# Patient Record
Sex: Male | Born: 1990
Health system: Southern US, Community
[De-identification: ages and names within clinical notes are randomized; demographics above are authoritative.]

## PROBLEM LIST (undated history)

## (undated) DIAGNOSIS — Z113 Encounter for screening for infections with a predominantly sexual mode of transmission: Secondary | ICD-10-CM

## (undated) DIAGNOSIS — N179 Acute kidney failure, unspecified: Secondary | ICD-10-CM

## (undated) DIAGNOSIS — Z21 Asymptomatic human immunodeficiency virus [HIV] infection status: Secondary | ICD-10-CM

## (undated) DIAGNOSIS — B20 Human immunodeficiency virus [HIV] disease: Secondary | ICD-10-CM

## (undated) DIAGNOSIS — I10 Essential (primary) hypertension: Secondary | ICD-10-CM

## (undated) DIAGNOSIS — F17200 Nicotine dependence, unspecified, uncomplicated: Secondary | ICD-10-CM

## (undated) HISTORY — DX: Human immunodeficiency virus (HIV) disease: B20

## (undated) HISTORY — DX: Asymptomatic human immunodeficiency virus (hiv) infection status: Z21

## (undated) HISTORY — DX: Encounter for screening for infections with a predominantly sexual mode of transmission: Z11.3

## (undated) HISTORY — DX: Acute kidney failure, unspecified: N17.9

## (undated) HISTORY — DX: Nicotine dependence, unspecified, uncomplicated: F17.200

---
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
## 2008-02-07 ENCOUNTER — Emergency Department (HOSPITAL_COMMUNITY): Admission: EM | Admit: 2008-02-07 | Discharge: 2008-02-07 | Payer: Self-pay | Admitting: Family Medicine

## 2008-05-04 ENCOUNTER — Emergency Department (HOSPITAL_COMMUNITY): Admission: EM | Admit: 2008-05-04 | Discharge: 2008-05-04 | Payer: Self-pay | Admitting: Emergency Medicine

## 2008-12-03 ENCOUNTER — Emergency Department (HOSPITAL_COMMUNITY): Admission: EM | Admit: 2008-12-03 | Discharge: 2008-12-03 | Payer: Self-pay | Admitting: Emergency Medicine

## 2009-07-18 ENCOUNTER — Ambulatory Visit: Payer: Self-pay | Admitting: Diagnostic Radiology

## 2009-07-18 ENCOUNTER — Emergency Department (HOSPITAL_BASED_OUTPATIENT_CLINIC_OR_DEPARTMENT_OTHER): Admission: EM | Admit: 2009-07-18 | Discharge: 2009-07-18 | Payer: Self-pay | Admitting: Emergency Medicine

## 2010-05-15 ENCOUNTER — Emergency Department (HOSPITAL_COMMUNITY)
Admission: EM | Admit: 2010-05-15 | Discharge: 2010-05-15 | Disposition: A | Discharge status: Home / Self Care | Payer: Self-pay | Attending: Emergency Medicine | Admitting: Emergency Medicine

## 2010-05-15 ENCOUNTER — Inpatient Hospital Stay (INDEPENDENT_AMBULATORY_CARE_PROVIDER_SITE_OTHER)
Admission: RE | Admit: 2010-05-15 | Discharge: 2010-05-15 | Disposition: A | Discharge status: Home / Self Care | Payer: Self-pay | Source: Ambulatory Visit | Attending: Family Medicine | Admitting: Family Medicine

## 2010-05-15 DIAGNOSIS — R109 Unspecified abdominal pain: Secondary | ICD-10-CM | POA: Insufficient documentation

## 2010-05-15 LAB — POCT URINALYSIS DIP (DEVICE)
Glucose, UA: NEGATIVE mg/dL
Hgb urine dipstick: NEGATIVE
Ketones, ur: NEGATIVE mg/dL
Specific Gravity, Urine: 1.02 (ref 1.005–1.030)
Urobilinogen, UA: 0.2 mg/dL (ref 0.0–1.0)
pH: 7 (ref 5.0–8.0)

## 2010-06-02 LAB — DIFFERENTIAL
Basophils Absolute: 0 10*3/uL (ref 0.0–0.1)
Eosinophils Absolute: 0 10*3/uL (ref 0.0–0.7)
Eosinophils Relative: 0 % (ref 0–5)
Monocytes Absolute: 1.5 10*3/uL — ABNORMAL HIGH (ref 0.1–1.0)
Monocytes Relative: 19 % — ABNORMAL HIGH (ref 3–12)
Neutro Abs: 5.1 10*3/uL (ref 1.7–7.7)

## 2010-06-02 LAB — CBC
Platelets: 181 10*3/uL (ref 150–400)
RDW: 13.4 % (ref 11.5–15.5)
WBC: 7.8 10*3/uL (ref 4.0–10.5)

## 2010-06-02 LAB — POCT I-STAT, CHEM 8
Calcium, Ion: 1.13 mmol/L (ref 1.12–1.32)
Glucose, Bld: 85 mg/dL (ref 70–99)
Potassium: 3.4 mEq/L — ABNORMAL LOW (ref 3.5–5.1)
Sodium: 137 mEq/L (ref 135–145)

## 2010-06-02 LAB — POCT RAPID STREP A (OFFICE): Streptococcus, Group A Screen (Direct): NEGATIVE

## 2010-06-02 LAB — POCT INFECTIOUS MONO SCREEN: Mono Screen: NEGATIVE
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
## 2013-02-10 ENCOUNTER — Encounter (HOSPITAL_COMMUNITY): Payer: Self-pay | Admitting: Emergency Medicine

## 2013-02-10 ENCOUNTER — Emergency Department (INDEPENDENT_AMBULATORY_CARE_PROVIDER_SITE_OTHER)
Admission: EM | Admit: 2013-02-10 | Discharge: 2013-02-10 | Disposition: A | Discharge status: Home / Self Care | Payer: Self-pay | Source: Home / Self Care | Attending: Emergency Medicine | Admitting: Emergency Medicine

## 2013-02-10 DIAGNOSIS — H6123 Impacted cerumen, bilateral: Secondary | ICD-10-CM

## 2013-02-10 DIAGNOSIS — H6693 Otitis media, unspecified, bilateral: Secondary | ICD-10-CM

## 2013-02-10 DIAGNOSIS — H6690 Otitis media, unspecified, unspecified ear: Secondary | ICD-10-CM

## 2013-02-10 DIAGNOSIS — J069 Acute upper respiratory infection, unspecified: Secondary | ICD-10-CM

## 2013-02-10 DIAGNOSIS — H6120 Impacted cerumen, unspecified ear: Secondary | ICD-10-CM

## 2013-02-10 MED ORDER — AMOXICILLIN 500 MG PO CAPS: 1000.0000 mg | ORAL_CAPSULE | Freq: Three times a day (TID) | ORAL | Status: DC

## 2013-02-10 MED ORDER — FLUTICASONE PROPIONATE 50 MCG/ACT NA SUSP: 2.0000 | Freq: Every day | NASAL | Status: DC

## 2013-02-10 MED ORDER — PREDNISONE 20 MG PO TABS: 20.0000 mg | ORAL_TABLET | Freq: Two times a day (BID) | ORAL | Status: DC

## 2013-02-10 MED ORDER — HYDROCODONE-ACETAMINOPHEN 5-325 MG PO TABS
2.0000 | ORAL_TABLET | Freq: Once | ORAL | Status: AC
  Administered 2013-02-10: 2 via ORAL

## 2013-02-10 MED ORDER — HYDROCODONE-ACETAMINOPHEN 5-325 MG PO TABS
ORAL_TABLET | ORAL | Status: AC
  Filled 2013-02-10: qty 2

## 2013-02-10 MED ORDER — HYDROCODONE-ACETAMINOPHEN 5-325 MG PO TABS: ORAL_TABLET | ORAL | Status: DC

## 2013-02-10 NOTE — ED Notes (Signed)
Pt  Reports  Symptoms  Of  Nasal  Stuffyness        Sinus  Congestion  And  Pressure in  Both  Ears         He  Reports  Symptoms  Started  Yesterday         Symptoms  unreleived  By otc  Sinus  meds

## 2013-02-10 NOTE — ED Provider Notes (Signed)
Chief Complaint:   Chief Complaint  Patient presents with  . URI    History of Present Illness:   Darrell [name] is a 22 year old male who's had a one-week history of bilateral ear pain, pressure, and congestion. He's also had a headache, chills, nasal congestion with bloody drainage, and a cough productive of small amounts of sputum. He denies any fever, sore throat, or adenopathy.  Review of Systems:  Other than noted above, the patient denies any of the following symptoms: Systemic:  No fevers, chills, sweats, weight loss or gain, fatigue, or tiredness. Eye:  No redness, pain, discharge, itching, blurred vision, or diplopia. ENT:  No headache, nasal congestion, sneezing, itching, epistaxis, ear pain, congestion, decreased hearing, ringing in ears, vertigo, or tinnitus.  No oral lesions, sore throat, pain on swallowing, or hoarseness. Neck:  No mass, tenderness or adenopathy. Lungs:  No coughing, wheezing, or shortness of breath. Skin:  No rash or itching.  PMFSH:  Past medical history, family history, social history, meds, and allergies were reviewed. He is allergic to sulfa.  Physical Exam:   Vital signs:  BP 126/78  Pulse 72  Temp(Src) 98.6 F (37 C) (Oral)  Resp 16  SpO2 100% General:  Alert and oriented.  In no distress.  Skin warm and dry. Eye:  PERRL, full EOMs, lids and conjunctiva normal.   ENT:  Both TMs were occluded with cerumen, after irrigation, the canals are mildly erythematous, but both TMs were red, dull, with retraction, air-fluid levels, and bubbles behind the TMs.  Nasal mucosa not congested and without drainage.  Mucous membranes moist, no oral lesions, normal dentition, pharynx clear.  No cranial or facial pain to palplation. Neck:  Supple, full ROM.  No adenopathy, tenderness or mass.  Thyroid normal. Lungs:  Breath sounds clear and equal bilaterally.  No wheezes, rales or rhonchi. Heart:  Rhythm regular, without extrasystoles.  No gallops or murmers. Skin:   Clear, warm and dry.  Course in Urgent Care Center:   Cerumen impactions were irrigated until clear.  Assessment:  The primary encounter diagnosis was Bilateral otitis media. Diagnoses of Cerumen debris on tympanic membrane, bilateral and Viral upper respiratory infection were also pertinent to this visit.  Plan:   1.  Meds:  The following meds were prescribed:   Discharge Medication List as of 02/10/2013  9:39 AM    START taking these medications   Details  amoxicillin (AMOXIL) 500 MG capsule Take 2 capsules (1,000 mg total) by mouth 3 (three) times daily., Starting 02/10/2013, Until Discontinued, Normal    fluticasone (FLONASE) 50 MCG/ACT nasal spray Place 2 sprays into both nostrils daily., Starting 02/10/2013, Until Discontinued, Normal    HYDROcodone-acetaminophen (NORCO/VICODIN) 5-325 MG per tablet 1 to 2 tabs every 4 to 6 hours as needed for pain., Print    predniSONE (DELTASONE) 20 MG tablet Take 1 tablet (20 mg total) by mouth 2 (two) times daily., Starting 02/10/2013, Until Discontinued, Normal        2.  Patient Education/Counseling:  The patient was given appropriate handouts, self care instructions, and instructed in symptomatic relief.  Suggested no water in the ear.  3.  Follow up:  The patient was told to follow up if no better in 3 to 4 days, if becoming worse in any way, and given some red flag symptoms such as fever which would prompt immediate return.  Follow up with Dr. Brynda Peon for a recheck on the ears in 2 weeks.  Reuben Likes, MD 02/10/13 2219

## 2013-09-23 ENCOUNTER — Ambulatory Visit (INDEPENDENT_AMBULATORY_CARE_PROVIDER_SITE_OTHER): Payer: Self-pay | Admitting: Licensed Clinical Social Worker

## 2013-09-23 DIAGNOSIS — Z113 Encounter for screening for infections with a predominantly sexual mode of transmission: Secondary | ICD-10-CM

## 2013-09-23 DIAGNOSIS — Z23 Encounter for immunization: Secondary | ICD-10-CM

## 2013-09-23 DIAGNOSIS — B20 Human immunodeficiency virus [HIV] disease: Secondary | ICD-10-CM

## 2013-09-23 DIAGNOSIS — Z79899 Other long term (current) drug therapy: Secondary | ICD-10-CM

## 2013-09-23 LAB — CBC WITH DIFFERENTIAL/PLATELET
BASOS ABS: 0 10*3/uL (ref 0.0–0.1)
BASOS PCT: 1 % (ref 0–1)
EOS ABS: 0.1 10*3/uL (ref 0.0–0.7)
Eosinophils Relative: 2 % (ref 0–5)
HCT: 44.9 % (ref 39.0–52.0)
HEMOGLOBIN: 15.8 g/dL (ref 13.0–17.0)
Lymphocytes Relative: 38 % (ref 12–46)
Lymphs Abs: 1.5 10*3/uL (ref 0.7–4.0)
MCH: 29.3 pg (ref 26.0–34.0)
MCHC: 35.2 g/dL (ref 30.0–36.0)
MCV: 83.1 fL (ref 78.0–100.0)
MONO ABS: 0.8 10*3/uL (ref 0.1–1.0)
MONOS PCT: 19 % — AB (ref 3–12)
Neutro Abs: 1.6 10*3/uL — ABNORMAL LOW (ref 1.7–7.7)
Neutrophils Relative %: 40 % — ABNORMAL LOW (ref 43–77)
PLATELETS: 260 10*3/uL (ref 150–400)
RBC: 5.4 MIL/uL (ref 4.22–5.81)
RDW: 13.9 % (ref 11.5–15.5)
WBC: 4 10*3/uL (ref 4.0–10.5)

## 2013-09-23 LAB — LIPID PANEL
Cholesterol: 123 mg/dL (ref 0–200)
HDL: 43 mg/dL (ref >39)
LDL CALC: 66 mg/dL (ref 0–99)
Total CHOL/HDL Ratio: 2.9 Ratio
Triglycerides: 71 mg/dL (ref <150)
VLDL: 14 mg/dL (ref 0–40)

## 2013-09-23 LAB — COMPLETE METABOLIC PANEL WITH GFR
ALBUMIN: 4.6 g/dL (ref 3.5–5.2)
ALT: 18 U/L (ref 0–53)
AST: 21 U/L (ref 0–37)
Alkaline Phosphatase: 59 U/L (ref 39–117)
BUN: 11 mg/dL (ref 6–23)
CO2: 30 mEq/L (ref 19–32)
CREATININE: 1.33 mg/dL (ref 0.50–1.35)
Calcium: 9.7 mg/dL (ref 8.4–10.5)
Chloride: 100 mEq/L (ref 96–112)
GFR, EST AFRICAN AMERICAN: 85 mL/min
GFR, EST NON AFRICAN AMERICAN: 74 mL/min
GLUCOSE: 79 mg/dL (ref 70–99)
POTASSIUM: 3.9 meq/L (ref 3.5–5.3)
SODIUM: 139 meq/L (ref 135–145)
TOTAL PROTEIN: 7.1 g/dL (ref 6.0–8.3)
Total Bilirubin: 1 mg/dL (ref 0.2–1.2)

## 2013-09-24 LAB — RPR

## 2013-09-24 LAB — URINALYSIS
BILIRUBIN URINE: NEGATIVE
GLUCOSE, UA: NEGATIVE mg/dL
Hgb urine dipstick: NEGATIVE
Ketones, ur: NEGATIVE mg/dL
Leukocytes, UA: NEGATIVE
Nitrite: NEGATIVE
PH: 6.5 (ref 5.0–8.0)
Protein, ur: NEGATIVE mg/dL
UROBILINOGEN UA: 0.2 mg/dL (ref 0.0–1.0)

## 2013-09-24 LAB — HEPATITIS C ANTIBODY: HCV Ab: NEGATIVE

## 2013-09-24 LAB — T-HELPER CELL (CD4) - (RCID CLINIC ONLY)
CD4 % Helper T Cell: 27 % — ABNORMAL LOW (ref 33–55)
CD4 T CELL ABS: 490 /uL (ref 400–2700)

## 2013-09-24 LAB — HEPATITIS B SURFACE ANTIGEN: Hepatitis B Surface Ag: NEGATIVE

## 2013-09-24 LAB — HEPATITIS B SURFACE ANTIBODY,QUALITATIVE: Hep B S Ab: POSITIVE — AB

## 2013-09-24 LAB — HEPATITIS B CORE ANTIBODY, TOTAL: HEP B C TOTAL AB: NONREACTIVE

## 2013-09-24 LAB — HEPATITIS A ANTIBODY, TOTAL: Hep A Total Ab: NONREACTIVE

## 2013-09-25 LAB — HIV-1 RNA ULTRAQUANT REFLEX TO GENTYP+
HIV 1 RNA QUANT: 327 {copies}/mL — AB (ref <20)
HIV-1 RNA QUANT, LOG: 2.51 {Log} — AB (ref <1.30)

## 2013-09-25 NOTE — Progress Notes (Signed)
Patient here today for new 042 intake, he tested positive in early July, after having a negative test in June. He currently works as a [employer] and has a partner of 6years that is also 042 positive. He reports that he does not have any symptoms, and no other health problems. He was given condoms today, along with patient education. Immunizations updated, and records received. Patient will return on 10/08/2013

## 2013-09-27 LAB — HLA B*5701: HLA-B 5701 W/RFLX HLA-B HIGH: NEGATIVE

## 2013-10-02 ENCOUNTER — Telehealth: Payer: Self-pay | Admitting: *Deleted

## 2013-10-02 LAB — TB SKIN TEST
INDURATION: 0 mm
TB Skin Test: NEGATIVE

## 2013-10-02 NOTE — Telephone Encounter (Signed)
Pt trying to locate requested financial documents to bring to [facility].  At work to day and will ask for copies of check stubs.  Will print Tax return "off line" to bring in to next appt.

## 2013-10-08 ENCOUNTER — Encounter: Payer: Self-pay | Admitting: Infectious Disease

## 2013-10-08 ENCOUNTER — Ambulatory Visit (INDEPENDENT_AMBULATORY_CARE_PROVIDER_SITE_OTHER): Payer: No Typology Code available for payment source | Admitting: Infectious Disease

## 2013-10-08 VITALS — BP 137/92 | HR 60 | Temp 98.1°F | Wt 183.0 lb

## 2013-10-08 DIAGNOSIS — B20 Human immunodeficiency virus [HIV] disease: Secondary | ICD-10-CM

## 2013-10-08 DIAGNOSIS — Z23 Encounter for immunization: Secondary | ICD-10-CM

## 2013-10-08 MED ORDER — ABACAVIR-DOLUTEGRAVIR-LAMIVUD 600-50-300 MG PO TABS: 1.0000 | ORAL_TABLET | Freq: Every day | ORAL | Status: DC

## 2013-10-08 NOTE — Progress Notes (Signed)
   Subjective:    Patient ID: Darrell [name], male    DOB: [birth year], 23 y.o.   MRN: [id number]  HPI  23 year old [place] man newly diagnosed with HIV, who tested positive in July after negative test in June. He has HIV + partner. His VL at intake was in the 300s and CD4 above 400.  No resistance data back. HLA b701 negative.  Lab Results  Component Value Date   HIV1RNAQUANT 327* 09/23/2013   Lab Results  Component Value Date   CD4TABS 490 09/23/2013     Review of Systems  Constitutional: Negative for fever, chills, diaphoresis, activity change, appetite change, fatigue and unexpected weight change.  HENT: Negative for congestion, rhinorrhea, sinus pressure, sneezing, sore throat and trouble swallowing.   Eyes: Negative for photophobia and visual disturbance.  Respiratory: Negative for cough, chest tightness, shortness of breath, wheezing and stridor.   Cardiovascular: Negative for chest pain, palpitations and leg swelling.  Gastrointestinal: Negative for nausea, vomiting, abdominal pain, diarrhea, constipation, blood in stool, abdominal distention and anal bleeding.  Genitourinary: Negative for dysuria, hematuria, flank pain and difficulty urinating.  Musculoskeletal: Negative for arthralgias, back pain, gait problem, joint swelling and myalgias.  Skin: Negative for color change, pallor, rash and wound.  Neurological: Negative for dizziness, tremors, weakness and light-headedness.  Hematological: Negative for adenopathy. Does not bruise/bleed easily.  Psychiatric/Behavioral: Negative for behavioral problems, confusion, sleep disturbance, dysphoric mood, decreased concentration and agitation.       Objective:   Physical Exam  Nursing note and vitals reviewed. Constitutional: He is oriented to person, place, and time. He appears well-developed and well-nourished. No distress.  HENT:  Head: Normocephalic and atraumatic.  Mouth/Throat: Oropharynx is clear and moist. No  oropharyngeal exudate.  Eyes: Conjunctivae and EOM are normal. Pupils are equal, round, and reactive to light. No scleral icterus.  Neck: Normal range of motion. Neck supple. No JVD present.  Cardiovascular: Normal rate, regular rhythm and normal heart sounds.  Exam reveals no gallop and no friction rub.   No murmur heard. Pulmonary/Chest: Effort normal and breath sounds normal. No respiratory distress. He has no wheezes. He has no rales. He exhibits no tenderness.  Abdominal: He exhibits no distension and no mass. There is no tenderness. There is no rebound and no guarding.  Musculoskeletal: He exhibits no edema and no tenderness.  Lymphadenopathy:    He has no cervical adenopathy.  Neurological: He is alert and oriented to person, place, and time. He exhibits normal muscle tone. Coordination normal.  Skin: Skin is warm and dry. He is not diaphoretic. No erythema. No pallor.  Psychiatric: He has a normal mood and affect. His behavior is normal. Judgment and thought content normal.          Assessment & Plan:   HIV. After extensive discussion decided to go with TRIUMEQ  He is to not take MVI at the same time unless he also eats at that time  I spent greater than 45 minutes with the patient including greater than 50% of time in face to face counsel of the patient and in coordination of their care.  Need for prophylactic vaccination: give hep A vax #1

## 2013-10-10 LAB — HIV-1 RNA ULTRAQUANT REFLEX TO GENTYP+
HIV 1 RNA Quant: 356 copies/mL — ABNORMAL HIGH (ref <20)
HIV-1 RNA Quant, Log: 2.55 {Log} — ABNORMAL HIGH (ref <1.30)

## 2013-10-17 LAB — HIV-1 INTEGRASE GENOTYPE

## 2013-10-28 ENCOUNTER — Other Ambulatory Visit (INDEPENDENT_AMBULATORY_CARE_PROVIDER_SITE_OTHER): Payer: No Typology Code available for payment source

## 2013-10-28 DIAGNOSIS — B20 Human immunodeficiency virus [HIV] disease: Secondary | ICD-10-CM

## 2013-10-28 LAB — CBC WITH DIFFERENTIAL/PLATELET
Basophils Absolute: 0 10*3/uL (ref 0.0–0.1)
Basophils Relative: 0 % (ref 0–1)
Eosinophils Absolute: 0.1 10*3/uL (ref 0.0–0.7)
Eosinophils Relative: 1 % (ref 0–5)
HEMATOCRIT: 40.6 % (ref 39.0–52.0)
HEMOGLOBIN: 14.3 g/dL (ref 13.0–17.0)
LYMPHS PCT: 26 % (ref 12–46)
Lymphs Abs: 2 10*3/uL (ref 0.7–4.0)
MCH: 29.1 pg (ref 26.0–34.0)
MCHC: 35.2 g/dL (ref 30.0–36.0)
MCV: 82.7 fL (ref 78.0–100.0)
MONO ABS: 0.8 10*3/uL (ref 0.1–1.0)
Monocytes Relative: 11 % (ref 3–12)
NEUTROS ABS: 4.7 10*3/uL (ref 1.7–7.7)
Neutrophils Relative %: 62 % (ref 43–77)
Platelets: 285 10*3/uL (ref 150–400)
RBC: 4.91 MIL/uL (ref 4.22–5.81)
RDW: 14.5 % (ref 11.5–15.5)
WBC: 7.6 10*3/uL (ref 4.0–10.5)

## 2013-10-29 LAB — HIV-1 RNA QUANT-NO REFLEX-BLD
HIV 1 RNA Quant: <20 copies/mL (ref <20)
HIV-1 RNA Quant, Log: <1.3 {Log} (ref <1.30)

## 2013-10-29 LAB — COMPLETE METABOLIC PANEL WITH GFR
ALBUMIN: 4.3 g/dL (ref 3.5–5.2)
ALT: 11 U/L (ref 0–53)
AST: 17 U/L (ref 0–37)
Alkaline Phosphatase: 55 U/L (ref 39–117)
BUN: 8 mg/dL (ref 6–23)
CALCIUM: 9.8 mg/dL (ref 8.4–10.5)
CHLORIDE: 105 meq/L (ref 96–112)
CO2: 28 meq/L (ref 19–32)
Creat: 1.28 mg/dL (ref 0.50–1.35)
GFR, EST AFRICAN AMERICAN: 89 mL/min
GFR, EST NON AFRICAN AMERICAN: 77 mL/min
GLUCOSE: 115 mg/dL — AB (ref 70–99)
POTASSIUM: 4.2 meq/L (ref 3.5–5.3)
Sodium: 139 mEq/L (ref 135–145)
Total Bilirubin: 0.9 mg/dL (ref 0.2–1.2)
Total Protein: 6.7 g/dL (ref 6.0–8.3)

## 2013-10-29 LAB — T-HELPER CELL (CD4) - (RCID CLINIC ONLY)
CD4 % Helper T Cell: 25 % — ABNORMAL LOW (ref 33–55)
CD4 T CELL ABS: 480 /uL (ref 400–2700)

## 2013-11-03 ENCOUNTER — Encounter: Payer: Self-pay | Admitting: Infectious Disease

## 2013-11-03 ENCOUNTER — Ambulatory Visit (INDEPENDENT_AMBULATORY_CARE_PROVIDER_SITE_OTHER): Payer: No Typology Code available for payment source | Admitting: Infectious Disease

## 2013-11-03 VITALS — BP 136/79 | HR 90 | Temp 98.3°F | Wt 181.0 lb

## 2013-11-03 DIAGNOSIS — Z23 Encounter for immunization: Secondary | ICD-10-CM

## 2013-11-03 DIAGNOSIS — B20 Human immunodeficiency virus [HIV] disease: Secondary | ICD-10-CM

## 2013-11-03 NOTE — Progress Notes (Signed)
   Subjective:    Patient ID: Darrell [name], male    DOB: October 27, 1990, 23 y.o.   MRN: [id number]  HPI   23 year old [place] man newly diagnosed with HIV, who tested positive in July after negative test in June. He has HIV + partner. His VL at intake was in the 300s and CD4 above 400.  No resistance data back. HLA b701 negative.  Since then he has fully suppress his virus and maintains a healthy CD4 count. He has no complaints today.  Lab Results  Component Value Date   HIV1RNAQUANT <20 10/28/2013   Lab Results  Component Value Date   CD4TABS 480 10/28/2013   CD4TABS 490 09/23/2013     Review of Systems  Constitutional: Negative for fever, chills, diaphoresis, activity change, appetite change, fatigue and unexpected weight change.  HENT: Negative for congestion, rhinorrhea, sinus pressure, sneezing, sore throat and trouble swallowing.   Eyes: Negative for photophobia and visual disturbance.  Respiratory: Negative for cough, chest tightness, shortness of breath, wheezing and stridor.   Cardiovascular: Negative for chest pain, palpitations and leg swelling.  Gastrointestinal: Negative for nausea, vomiting, abdominal pain, diarrhea, constipation, blood in stool, abdominal distention and anal bleeding.  Genitourinary: Negative for dysuria, hematuria, flank pain and difficulty urinating.  Musculoskeletal: Negative for arthralgias, back pain, gait problem, joint swelling and myalgias.  Skin: Negative for color change, pallor, rash and wound.  Neurological: Negative for dizziness, tremors, weakness and light-headedness.  Hematological: Negative for adenopathy. Does not bruise/bleed easily.  Psychiatric/Behavioral: Negative for behavioral problems, confusion, sleep disturbance, dysphoric mood, decreased concentration and agitation.       Objective:   Physical Exam  Nursing note and vitals reviewed. Constitutional: He is oriented to person, place, and time. He appears  well-developed and well-nourished. No distress.  HENT:  Head: Normocephalic and atraumatic.  Mouth/Throat: Oropharynx is clear and moist. No oropharyngeal exudate.  Eyes: Conjunctivae and EOM are normal. Pupils are equal, round, and reactive to light. No scleral icterus.  Neck: Normal range of motion. Neck supple. No JVD present.  Cardiovascular: Normal rate, regular rhythm and normal heart sounds.  Exam reveals no gallop and no friction rub.   No murmur heard. Pulmonary/Chest: Effort normal and breath sounds normal. No respiratory distress. He has no wheezes. He has no rales. He exhibits no tenderness.  Abdominal: He exhibits no distension and no mass. There is no tenderness. There is no rebound and no guarding.  Musculoskeletal: He exhibits no edema and no tenderness.  Lymphadenopathy:    He has no cervical adenopathy.  Neurological: He is alert and oriented to person, place, and time. He exhibits normal muscle tone. Coordination normal.  Skin: Skin is warm and dry. He is not diaphoretic. No erythema. No pallor.  Psychiatric: He has a normal mood and affect. His behavior is normal. Judgment and thought content normal.          Assessment & Plan:   HIV. Continue TRIUMEQ, recheck labs in 3 months

## 2013-11-05 NOTE — Addendum Note (Signed)
Addended by: Starleen Arms D on: 11/05/2013 09:16 AM   Modules accepted: Orders

## 2013-11-08 ENCOUNTER — Emergency Department (INDEPENDENT_AMBULATORY_CARE_PROVIDER_SITE_OTHER)
Admission: EM | Admit: 2013-11-08 | Discharge: 2013-11-08 | Disposition: A | Discharge status: Home / Self Care | Payer: No Typology Code available for payment source | Source: Home / Self Care | Attending: Family Medicine | Admitting: Family Medicine

## 2013-11-08 ENCOUNTER — Encounter (HOSPITAL_COMMUNITY): Payer: Self-pay | Admitting: Emergency Medicine

## 2013-11-08 DIAGNOSIS — K600 Acute anal fissure: Secondary | ICD-10-CM

## 2013-11-08 DIAGNOSIS — K602 Anal fissure, unspecified: Secondary | ICD-10-CM

## 2013-11-08 MED ORDER — HYDROCORTISONE ACETATE 25 MG RE SUPP: 25.0000 mg | Freq: Two times a day (BID) | RECTAL | Status: DC

## 2013-11-08 NOTE — ED Notes (Signed)
Pt  Reports  pain  In  Rectal  Area    Blood  On  Tissue  After  Wipes         Symptoms  X  sev  Weeks    -   Sitting  Upright on  The  Exam table  Speaking in  Complete  sentances

## 2013-11-08 NOTE — ED Provider Notes (Signed)
CSN: [id number]     Arrival date & time 11/08/13  0865 History   First MD Initiated Contact with Patient 11/08/13 [phone]     Chief Complaint  Patient presents with  . Rectal Pain   (Consider location/radiation/quality/duration/timing/severity/associated sxs/prior Treatment) Patient is a 23 y.o. male presenting with hematochezia. The history is provided by the patient.  Rectal Bleeding Quality:  Bright red Amount:  Scant Duration:  2 weeks Progression:  Improving Chronicity:  New Context: anal fissures, constipation and rectal pain   Context: not hemorrhoids   Similar prior episodes: no   Relieved by:  None tried Worsened by:  Nothing tried Ineffective treatments:  Hemorrhoid cream Associated symptoms: no abdominal pain   Risk factors comment:  Hiv disease   Past Medical History  Diagnosis Date  . HIV infection    History reviewed. No pertinent past surgical history. Family History  Problem Relation Age of Onset  . Cancer Mother   . Fibromyalgia Mother    History  Substance Use Topics  . Smoking status: Current Some Day Smoker  . Smokeless tobacco: Not on file     Comment: 4 x weekly  . Alcohol Use: Yes    Review of Systems  Constitutional: Negative.   Gastrointestinal: Positive for blood in stool, hematochezia, anal bleeding and rectal pain. Negative for abdominal pain.    Allergies  Sulfonamide derivatives  Home Medications   Prior to Admission medications   Medication Sig Start Date End Date Taking? Authorizing Provider  Abacavir-Dolutegravir-Lamivud (TRIUMEQ) 600-50-300 MG TABS Take 1 tablet by mouth daily. 10/08/13   Randall Hiss, MD  hydrocortisone (ANUSOL-HC) 25 MG suppository Place 1 suppository (25 mg total) rectally 2 (two) times daily. 11/08/13   Linna Hoff, MD   BP 124/86  Pulse 66  Temp(Src) 98.7 F (37.1 C) (Oral)  Resp 16  SpO2 100% Physical Exam  Nursing note and vitals reviewed. Constitutional: He is oriented to person, place,  and time. He appears well-developed and well-nourished.  Abdominal: Soft. Bowel sounds are normal. He exhibits no distension and no mass. There is no tenderness. There is no rebound and no guarding.  Genitourinary: Rectal exam shows fissure and tenderness. Rectal exam shows no external hemorrhoid, no internal hemorrhoid and no mass.  Neurological: He is alert and oriented to person, place, and time.  Skin: Skin is warm and dry.    ED Course  Procedures (including critical care time) Labs Review Labs Reviewed - No data to display  Imaging Review No results found.   MDM   1. Acute anal fissure        Linna Hoff, MD 11/08/13 1006

## 2013-12-08 ENCOUNTER — Other Ambulatory Visit: Payer: Self-pay | Admitting: Licensed Clinical Social Worker

## 2013-12-08 DIAGNOSIS — B20 Human immunodeficiency virus [HIV] disease: Secondary | ICD-10-CM

## 2013-12-08 MED ORDER — ABACAVIR-DOLUTEGRAVIR-LAMIVUD 600-50-300 MG PO TABS: 1.0000 | ORAL_TABLET | Freq: Every day | ORAL | Status: DC

## 2014-01-19 ENCOUNTER — Other Ambulatory Visit: Payer: No Typology Code available for payment source

## 2014-01-21 ENCOUNTER — Other Ambulatory Visit: Payer: No Typology Code available for payment source

## 2014-01-21 DIAGNOSIS — B20 Human immunodeficiency virus [HIV] disease: Secondary | ICD-10-CM

## 2014-01-21 DIAGNOSIS — Z113 Encounter for screening for infections with a predominantly sexual mode of transmission: Secondary | ICD-10-CM

## 2014-01-22 LAB — CBC WITH DIFFERENTIAL/PLATELET
BASOS ABS: 0 10*3/uL (ref 0.0–0.1)
BASOS PCT: 0 % (ref 0–1)
EOS ABS: 0.1 10*3/uL (ref 0.0–0.7)
EOS PCT: 2 % (ref 0–5)
HCT: 41.4 % (ref 39.0–52.0)
Hemoglobin: 14 g/dL (ref 13.0–17.0)
Lymphocytes Relative: 63 % — ABNORMAL HIGH (ref 12–46)
Lymphs Abs: 2.9 10*3/uL (ref 0.7–4.0)
MCH: 29.2 pg (ref 26.0–34.0)
MCHC: 33.8 g/dL (ref 30.0–36.0)
MCV: 86.4 fL (ref 78.0–100.0)
MPV: 8.6 fL — AB (ref 9.4–12.4)
Monocytes Absolute: 0.5 10*3/uL (ref 0.1–1.0)
Monocytes Relative: 10 % (ref 3–12)
NEUTROS PCT: 25 % — AB (ref 43–77)
Neutro Abs: 1.2 10*3/uL — ABNORMAL LOW (ref 1.7–7.7)
PLATELETS: 268 10*3/uL (ref 150–400)
RBC: 4.79 MIL/uL (ref 4.22–5.81)
RDW: 13.4 % (ref 11.5–15.5)
WBC: 4.6 10*3/uL (ref 4.0–10.5)

## 2014-01-22 LAB — COMPLETE METABOLIC PANEL WITH GFR
ALT: 18 U/L (ref 0–53)
AST: 18 U/L (ref 0–37)
Albumin: 4 g/dL (ref 3.5–5.2)
Alkaline Phosphatase: 52 U/L (ref 39–117)
BILIRUBIN TOTAL: 1 mg/dL (ref 0.2–1.2)
BUN: 11 mg/dL (ref 6–23)
CALCIUM: 9.3 mg/dL (ref 8.4–10.5)
CHLORIDE: 105 meq/L (ref 96–112)
CO2: 30 mEq/L (ref 19–32)
CREATININE: 1.19 mg/dL (ref 0.50–1.35)
GFR, Est African American: >89 mL/min
GFR, Est Non African American: 84 mL/min
Glucose, Bld: 84 mg/dL (ref 70–99)
Potassium: 4 mEq/L (ref 3.5–5.3)
Sodium: 140 mEq/L (ref 135–145)
Total Protein: 6.3 g/dL (ref 6.0–8.3)

## 2014-01-22 LAB — URINE CYTOLOGY ANCILLARY ONLY
CHLAMYDIA, DNA PROBE: NEGATIVE
Neisseria Gonorrhea: NEGATIVE

## 2014-01-22 LAB — HIV-1 RNA QUANT-NO REFLEX-BLD
HIV 1 RNA QUANT: 66 {copies}/mL — AB (ref <20)
HIV-1 RNA Quant, Log: 1.82 {Log} — ABNORMAL HIGH (ref <1.30)

## 2014-01-22 LAB — RPR

## 2014-01-22 LAB — T-HELPER CELL (CD4) - (RCID CLINIC ONLY)
CD4 % Helper T Cell: 31 % — ABNORMAL LOW (ref 33–55)
CD4 T Cell Abs: 910 /uL (ref 400–2700)

## 2014-02-02 ENCOUNTER — Ambulatory Visit: Payer: Self-pay | Admitting: Infectious Disease

## 2014-02-11 ENCOUNTER — Ambulatory Visit (INDEPENDENT_AMBULATORY_CARE_PROVIDER_SITE_OTHER): Payer: No Typology Code available for payment source | Admitting: Infectious Disease

## 2014-02-11 ENCOUNTER — Encounter: Payer: Self-pay | Admitting: Infectious Disease

## 2014-02-11 VITALS — BP 124/80 | HR 64 | Temp 98.2°F | Wt 170.0 lb

## 2014-02-11 DIAGNOSIS — Z23 Encounter for immunization: Secondary | ICD-10-CM

## 2014-02-11 DIAGNOSIS — B20 Human immunodeficiency virus [HIV] disease: Secondary | ICD-10-CM

## 2014-02-11 NOTE — Progress Notes (Signed)
   Subjective:    Patient ID: Darrell [name], male    DOB: [birth year], 23 y.o.   MRN: [id number]  HPI   23 year old [place] man newly diagnosed with HIV, who tested positive in July after negative test in June. He has HIV + partner. His VL at intake was in the 300s and CD4 above 400.  No resistance data back. HLA b701 negative.  Since then he has fully suppress his virus on TRIUMEQ and maintains a healthy CD4 count. He has no complaints today.  Lab Results  Component Value Date   HIV1RNAQUANT 66* 01/21/2014   Lab Results  Component Value Date   CD4TABS 910 01/21/2014   CD4TABS 480 10/28/2013   CD4TABS 490 09/23/2013   He did miss one month of TRIUMEQ due to change in his insurance.  Review of Systems  Constitutional: Negative for fever, chills, diaphoresis, activity change, appetite change, fatigue and unexpected weight change.  HENT: Negative for congestion, rhinorrhea, sinus pressure, sneezing, sore throat and trouble swallowing.   Eyes: Negative for photophobia and visual disturbance.  Respiratory: Negative for cough, chest tightness, shortness of breath, wheezing and stridor.   Cardiovascular: Negative for chest pain, palpitations and leg swelling.  Gastrointestinal: Negative for nausea, vomiting, abdominal pain, diarrhea, constipation, blood in stool, abdominal distention and anal bleeding.  Genitourinary: Negative for dysuria, hematuria, flank pain and difficulty urinating.  Musculoskeletal: Negative for myalgias, back pain, joint swelling, arthralgias and gait problem.  Skin: Negative for color change, pallor, rash and wound.  Neurological: Negative for dizziness, tremors, weakness and light-headedness.  Hematological: Negative for adenopathy. Does not bruise/bleed easily.  Psychiatric/Behavioral: Negative for behavioral problems, confusion, sleep disturbance, dysphoric mood, decreased concentration and agitation.       Objective:   Physical Exam    Constitutional: He is oriented to person, place, and time. He appears well-developed and well-nourished.  HENT:  Head: Normocephalic and atraumatic.  Eyes: Conjunctivae and EOM are normal.  Neck: Normal range of motion. Neck supple.  Cardiovascular: Normal rate and regular rhythm.   Pulmonary/Chest: Effort normal. No respiratory distress. He has no wheezes.  Abdominal: Soft. He exhibits no distension.  Musculoskeletal: Normal range of motion. He exhibits no edema or tenderness.  Neurological: He is alert and oriented to person, place, and time.  Skin: Skin is warm and dry. No rash noted. No erythema. No pallor.  Psychiatric: He has a normal mood and affect. His behavior is normal. Judgment and thought content normal.          Assessment & Plan:   HIV. Continue TRIUMEQ, recheck labs in 3 months  Need for Hep A vaccine: vaccinate #2 in 3 months

## 2014-02-22 ENCOUNTER — Other Ambulatory Visit: Payer: Self-pay | Admitting: Infectious Disease

## 2014-05-04 ENCOUNTER — Other Ambulatory Visit: Payer: No Typology Code available for payment source

## 2014-05-04 DIAGNOSIS — B20 Human immunodeficiency virus [HIV] disease: Secondary | ICD-10-CM

## 2014-05-04 LAB — CBC WITH DIFFERENTIAL/PLATELET
BASOS ABS: 0 10*3/uL (ref 0.0–0.1)
Basophils Relative: 0 % (ref 0–1)
EOS ABS: 0.1 10*3/uL (ref 0.0–0.7)
Eosinophils Relative: 2 % (ref 0–5)
HCT: 42.9 % (ref 39.0–52.0)
HEMOGLOBIN: 14.7 g/dL (ref 13.0–17.0)
Lymphocytes Relative: 49 % — ABNORMAL HIGH (ref 12–46)
Lymphs Abs: 2.5 10*3/uL (ref 0.7–4.0)
MCH: 29.9 pg (ref 26.0–34.0)
MCHC: 34.3 g/dL (ref 30.0–36.0)
MCV: 87.4 fL (ref 78.0–100.0)
MONOS PCT: 10 % (ref 3–12)
MPV: 8.3 fL — ABNORMAL LOW (ref 8.6–12.4)
Monocytes Absolute: 0.5 10*3/uL (ref 0.1–1.0)
NEUTROS PCT: 39 % — AB (ref 43–77)
Neutro Abs: 2 10*3/uL (ref 1.7–7.7)
PLATELETS: 279 10*3/uL (ref 150–400)
RBC: 4.91 MIL/uL (ref 4.22–5.81)
RDW: 14.3 % (ref 11.5–15.5)
WBC: 5.2 10*3/uL (ref 4.0–10.5)

## 2014-05-04 LAB — COMPLETE METABOLIC PANEL WITH GFR
ALT: 41 U/L (ref 0–53)
AST: 106 U/L — ABNORMAL HIGH (ref 0–37)
Albumin: 4 g/dL (ref 3.5–5.2)
Alkaline Phosphatase: 46 U/L (ref 39–117)
BUN: 11 mg/dL (ref 6–23)
CO2: 28 mEq/L (ref 19–32)
Calcium: 9.3 mg/dL (ref 8.4–10.5)
Chloride: 103 mEq/L (ref 96–112)
Creat: 1.19 mg/dL (ref 0.50–1.35)
GFR, Est African American: >89 mL/min
GFR, Est Non African American: 84 mL/min
GLUCOSE: 102 mg/dL — AB (ref 70–99)
POTASSIUM: 4.3 meq/L (ref 3.5–5.3)
SODIUM: 140 meq/L (ref 135–145)
TOTAL PROTEIN: 6.4 g/dL (ref 6.0–8.3)
Total Bilirubin: 0.6 mg/dL (ref 0.2–1.2)

## 2014-05-05 LAB — T-HELPER CELL (CD4) - (RCID CLINIC ONLY)
CD4 T CELL HELPER: 32 % — AB (ref 33–55)
CD4 T Cell Abs: 830 /uL (ref 400–2700)

## 2014-05-06 LAB — HIV-1 RNA QUANT-NO REFLEX-BLD: HIV 1 RNA Quant: <20 copies/mL (ref <20)

## 2014-05-20 ENCOUNTER — Ambulatory Visit (INDEPENDENT_AMBULATORY_CARE_PROVIDER_SITE_OTHER): Payer: No Typology Code available for payment source | Admitting: Infectious Disease

## 2014-05-20 ENCOUNTER — Encounter: Payer: Self-pay | Admitting: Infectious Disease

## 2014-05-20 VITALS — BP 145/79 | HR 78 | Temp 98.9°F | Ht 70.0 in | Wt 171.0 lb

## 2014-05-20 DIAGNOSIS — B20 Human immunodeficiency virus [HIV] disease: Secondary | ICD-10-CM | POA: Diagnosis not present

## 2014-05-20 DIAGNOSIS — Z23 Encounter for immunization: Secondary | ICD-10-CM | POA: Diagnosis not present

## 2014-05-20 DIAGNOSIS — R030 Elevated blood-pressure reading, without diagnosis of hypertension: Secondary | ICD-10-CM

## 2014-05-20 DIAGNOSIS — IMO0001 Reserved for inherently not codable concepts without codable children: Secondary | ICD-10-CM | POA: Insufficient documentation

## 2014-05-20 MED ORDER — ABACAVIR-DOLUTEGRAVIR-LAMIVUD 600-50-300 MG PO TABS: 1.0000 | ORAL_TABLET | Freq: Every day | ORAL | Status: DC

## 2014-05-20 NOTE — Progress Notes (Signed)
   Subjective:    Patient ID: Darrell [name], male    DOB: October 22, 1990, 24 y.o.   MRN: [id number]  HPI   24 year old [place] man with HIV, who tested positive in July after negative test in June of 2015. He has HIV + partner. His VL at intake was in the 300s and CD4 above 400.  No resistance data back. HLA b701 negative.  Since then he has fully suppress his virus on TRIUMEQ and maintains a healthy CD4 count. He has no complaints today.  Lab Results  Component Value Date   HIV1RNAQUANT <20 05/04/2014   Lab Results  Component Value Date   CD4TABS 830 05/04/2014   CD4TABS 910 01/21/2014   CD4TABS 480 10/28/2013   No issues with TRIUMEQ and he is doing great.  Review of Systems  Constitutional: Negative for fever, chills, diaphoresis, activity change, appetite change, fatigue and unexpected weight change.  HENT: Negative for congestion, rhinorrhea, sinus pressure, sneezing, sore throat and trouble swallowing.   Eyes: Negative for photophobia and visual disturbance.  Respiratory: Negative for cough, chest tightness, shortness of breath, wheezing and stridor.   Cardiovascular: Negative for chest pain, palpitations and leg swelling.  Gastrointestinal: Negative for nausea, vomiting, abdominal pain, diarrhea, constipation, blood in stool, abdominal distention and anal bleeding.  Genitourinary: Negative for dysuria, hematuria, flank pain and difficulty urinating.  Musculoskeletal: Negative for myalgias, back pain, joint swelling, arthralgias and gait problem.  Skin: Negative for color change, pallor, rash and wound.  Neurological: Negative for dizziness, tremors, weakness and light-headedness.  Hematological: Negative for adenopathy. Does not bruise/bleed easily.  Psychiatric/Behavioral: Negative for behavioral problems, confusion, sleep disturbance, dysphoric mood, decreased concentration and agitation.       Objective:   Physical Exam  Constitutional: He is oriented to  person, place, and time. He appears well-developed and well-nourished.  HENT:  Head: Normocephalic and atraumatic.  Eyes: Conjunctivae and EOM are normal.  Neck: Normal range of motion. Neck supple.  Cardiovascular: Normal rate and regular rhythm.   Pulmonary/Chest: Effort normal. No respiratory distress. He has no wheezes.  Abdominal: Soft. He exhibits no distension.  Musculoskeletal: Normal range of motion. He exhibits no edema or tenderness.  Neurological: He is alert and oriented to person, place, and time.  Skin: Skin is warm and dry. No rash noted. No erythema. No pallor.  Psychiatric: He has a normal mood and affect. His behavior is normal. Judgment and thought content normal.          Assessment & Plan:   HIV. Continue TRIUMEQ, recheck labs in 6 months  Need for Hep A vaccine:revaccinate  HTN: BP running a bit high today possibly white coate htn will need to watch

## 2014-09-01 ENCOUNTER — Other Ambulatory Visit: Payer: No Typology Code available for payment source

## 2014-09-01 DIAGNOSIS — B20 Human immunodeficiency virus [HIV] disease: Secondary | ICD-10-CM

## 2014-09-01 LAB — COMPLETE METABOLIC PANEL WITH GFR
ALK PHOS: 52 U/L (ref 39–117)
ALT: 18 U/L (ref 0–53)
AST: 20 U/L (ref 0–37)
Albumin: 4.1 g/dL (ref 3.5–5.2)
BUN: 13 mg/dL (ref 6–23)
CO2: 28 meq/L (ref 19–32)
CREATININE: 1.25 mg/dL (ref 0.50–1.35)
Calcium: 9.7 mg/dL (ref 8.4–10.5)
Chloride: 106 mEq/L (ref 96–112)
GFR, EST NON AFRICAN AMERICAN: 80 mL/min
GFR, Est African American: >89 mL/min
Glucose, Bld: 122 mg/dL — ABNORMAL HIGH (ref 70–99)
Potassium: 4 mEq/L (ref 3.5–5.3)
Sodium: 142 mEq/L (ref 135–145)
TOTAL PROTEIN: 6.8 g/dL (ref 6.0–8.3)
Total Bilirubin: 0.6 mg/dL (ref 0.2–1.2)

## 2014-09-01 LAB — CBC WITH DIFFERENTIAL/PLATELET
Basophils Absolute: 0 10*3/uL (ref 0.0–0.1)
Basophils Relative: 0 % (ref 0–1)
EOS PCT: 3 % (ref 0–5)
Eosinophils Absolute: 0.1 10*3/uL (ref 0.0–0.7)
HCT: 43.4 % (ref 39.0–52.0)
HEMOGLOBIN: 15 g/dL (ref 13.0–17.0)
Lymphocytes Relative: 50 % — ABNORMAL HIGH (ref 12–46)
Lymphs Abs: 2.1 10*3/uL (ref 0.7–4.0)
MCH: 30.9 pg (ref 26.0–34.0)
MCHC: 34.6 g/dL (ref 30.0–36.0)
MCV: 89.5 fL (ref 78.0–100.0)
MPV: 8.1 fL — AB (ref 8.6–12.4)
Monocytes Absolute: 0.4 10*3/uL (ref 0.1–1.0)
Monocytes Relative: 9 % (ref 3–12)
NEUTROS ABS: 1.6 10*3/uL — AB (ref 1.7–7.7)
Neutrophils Relative %: 38 % — ABNORMAL LOW (ref 43–77)
Platelets: 262 10*3/uL (ref 150–400)
RBC: 4.85 MIL/uL (ref 4.22–5.81)
RDW: 13.7 % (ref 11.5–15.5)
WBC: 4.1 10*3/uL (ref 4.0–10.5)

## 2014-09-01 LAB — LIPID PANEL
CHOL/HDL RATIO: 4 ratio
Cholesterol: 139 mg/dL (ref 0–200)
HDL: 35 mg/dL — ABNORMAL LOW (ref >=40)
LDL CALC: 78 mg/dL (ref 0–99)
TRIGLYCERIDES: 131 mg/dL (ref <150)
VLDL: 26 mg/dL (ref 0–40)

## 2014-09-01 NOTE — Addendum Note (Signed)
Addended by: Lavone NianMILLNER, SELMA D on: 09/01/2014 03:25 PM   Modules accepted: Orders

## 2014-09-02 LAB — RPR

## 2014-09-03 LAB — T-HELPER CELL (CD4) - (RCID CLINIC ONLY)
CD4 T CELL HELPER: 38 % (ref 33–55)
CD4 T Cell Abs: 830 /uL (ref 400–2700)

## 2014-09-03 LAB — HIV-1 RNA QUANT-NO REFLEX-BLD: HIV-1 RNA Quant, Log: <1.3 {Log} (ref <1.30)

## 2014-09-03 LAB — MICROALBUMIN / CREATININE URINE RATIO
Creatinine, Urine: 589.7 mg/dL
Microalb Creat Ratio: 1.7 mg/g (ref 0.0–30.0)
Microalb, Ur: 1 mg/dL (ref <2.0)

## 2014-09-09 ENCOUNTER — Encounter: Payer: Self-pay | Admitting: Infectious Disease

## 2014-09-09 ENCOUNTER — Ambulatory Visit (INDEPENDENT_AMBULATORY_CARE_PROVIDER_SITE_OTHER): Payer: No Typology Code available for payment source | Admitting: Infectious Disease

## 2014-09-09 VITALS — BP 147/92 | HR 87 | Temp 98.3°F | Ht 70.0 in | Wt 186.0 lb

## 2014-09-09 DIAGNOSIS — Z113 Encounter for screening for infections with a predominantly sexual mode of transmission: Secondary | ICD-10-CM

## 2014-09-09 DIAGNOSIS — B20 Human immunodeficiency virus [HIV] disease: Secondary | ICD-10-CM

## 2014-09-09 DIAGNOSIS — R030 Elevated blood-pressure reading, without diagnosis of hypertension: Secondary | ICD-10-CM

## 2014-09-09 DIAGNOSIS — IMO0001 Reserved for inherently not codable concepts without codable children: Secondary | ICD-10-CM

## 2014-09-09 DIAGNOSIS — Z0000 Encounter for general adult medical examination without abnormal findings: Secondary | ICD-10-CM | POA: Insufficient documentation

## 2014-09-09 HISTORY — DX: Encounter for screening for infections with a predominantly sexual mode of transmission: Z11.3

## 2014-09-09 NOTE — Progress Notes (Signed)
   Subjective:    Patient ID: [name], male    DOB: 1990/10/29, 24 y.o.   MRN: [id number]  HPI    Review of Systems  Constitutional: Negative for fever, chills, diaphoresis, activity change, appetite change, fatigue and unexpected weight change.  HENT: Negative for congestion, rhinorrhea, sinus pressure, sneezing, sore throat and trouble swallowing.   Eyes: Negative for photophobia and visual disturbance.  Respiratory: Negative for cough, chest tightness, shortness of breath, wheezing and stridor.   Cardiovascular: Negative for chest pain, palpitations and leg swelling.  Gastrointestinal: Negative for nausea, vomiting, abdominal pain, diarrhea, constipation, blood in stool, abdominal distention and anal bleeding.  Genitourinary: Negative for dysuria, hematuria, flank pain and difficulty urinating.  Musculoskeletal: Negative for myalgias, back pain, joint swelling, arthralgias and gait problem.  Skin: Negative for color change, pallor, rash and wound.  Neurological: Negative for dizziness, tremors, weakness and light-headedness.  Hematological: Negative for adenopathy. Does not bruise/bleed easily.  Psychiatric/Behavioral: Negative for behavioral problems, confusion, sleep disturbance, dysphoric mood, decreased concentration and agitation.     Subjective:    Patient ID: [name], male    DOB: Nov 01, 1990, 24 y.o.   MRN: [id number]  HPI   24 year old [place] man with HIV, who tested positive in July after negative test in June of 2015. He has HIV + partner. His VL at intake was in the 300s and CD4 above 400.  No resistance data back. HLA b701 negative.  Since then he has fully suppress his virus on TRIUMEQ and maintains a healthy CD4 count. He has no complaints today.  Lab Results  Component Value Date   HIV1RNAQUANT <20 09/01/2014   Lab Results  Component Value Date   CD4TABS 830 09/01/2014   CD4TABS 830 05/04/2014   CD4TABS 910 01/21/2014   No  issues with TRIUMEQ and he is doing great.   Assessment & Plan:   HIV. Continue TRIUMEQ, recheck labs in 6 months  Need for Hep A vaccine:revaccinate  HTN: BP running a bit high today possibly white coate htn will need to watch     Objective:   Physical Exam  Constitutional: He is oriented to person, place, and time. He appears well-developed and well-nourished.  HENT:  Head: Normocephalic and atraumatic.  Eyes: Conjunctivae and EOM are normal.  Neck: Normal range of motion. Neck supple.  Cardiovascular: Normal rate and regular rhythm.   Pulmonary/Chest: Effort normal. No respiratory distress. He has no wheezes.  Abdominal: Soft. He exhibits no distension.  Musculoskeletal: Normal range of motion. He exhibits no edema or tenderness.  Neurological: He is alert and oriented to person, place, and time.  Skin: Skin is warm and dry. No rash noted. No erythema. No pallor.  Psychiatric: He has a normal mood and affect. His behavior is normal. Judgment and thought content normal.          Assessment & Plan:   HIV: perfectly controlled. RTC In 6 months  STD screening took GC and chlamydia urine and rectal swabs  Pre-HTN: BP up at times. Pt to keep track of this. Mom also with HTN  Smoking cessation: we spent greater than 3 minutes counseling pt to stop smoking

## 2014-09-10 LAB — CYTOLOGY, (ORAL, ANAL, URETHRAL) ANCILLARY ONLY
Chlamydia: NEGATIVE
Neisseria Gonorrhea: NEGATIVE

## 2014-09-10 LAB — URINE CYTOLOGY ANCILLARY ONLY
CHLAMYDIA, DNA PROBE: NEGATIVE
NEISSERIA GONORRHEA: NEGATIVE

## 2014-11-03 ENCOUNTER — Other Ambulatory Visit: Payer: No Typology Code available for payment source

## 2014-11-03 DIAGNOSIS — B20 Human immunodeficiency virus [HIV] disease: Secondary | ICD-10-CM

## 2014-11-03 LAB — CBC WITH DIFFERENTIAL/PLATELET
BASOS PCT: 0 % (ref 0–1)
Basophils Absolute: 0 10*3/uL (ref 0.0–0.1)
Eosinophils Absolute: 0.1 10*3/uL (ref 0.0–0.7)
Eosinophils Relative: 2 % (ref 0–5)
HCT: 44.5 % (ref 39.0–52.0)
Hemoglobin: 15.4 g/dL (ref 13.0–17.0)
Lymphocytes Relative: 53 % — ABNORMAL HIGH (ref 12–46)
Lymphs Abs: 1.9 10*3/uL (ref 0.7–4.0)
MCH: 31 pg (ref 26.0–34.0)
MCHC: 34.6 g/dL (ref 30.0–36.0)
MCV: 89.7 fL (ref 78.0–100.0)
MONO ABS: 0.4 10*3/uL (ref 0.1–1.0)
MPV: 8.5 fL — ABNORMAL LOW (ref 8.6–12.4)
Monocytes Relative: 11 % (ref 3–12)
NEUTROS PCT: 34 % — AB (ref 43–77)
Neutro Abs: 1.2 10*3/uL — ABNORMAL LOW (ref 1.7–7.7)
Platelets: 279 10*3/uL (ref 150–400)
RBC: 4.96 MIL/uL (ref 4.22–5.81)
RDW: 13.8 % (ref 11.5–15.5)
WBC: 3.5 10*3/uL — ABNORMAL LOW (ref 4.0–10.5)

## 2014-11-03 LAB — COMPLETE METABOLIC PANEL WITH GFR
ALT: 19 U/L (ref 9–46)
AST: 21 U/L (ref 10–40)
Albumin: 4.2 g/dL (ref 3.6–5.1)
Alkaline Phosphatase: 48 U/L (ref 40–115)
BUN: 10 mg/dL (ref 7–25)
CHLORIDE: 104 mmol/L (ref 98–110)
CO2: 27 mmol/L (ref 20–31)
Calcium: 9.3 mg/dL (ref 8.6–10.3)
Creat: 1.2 mg/dL (ref 0.60–1.35)
GFR, EST NON AFRICAN AMERICAN: 83 mL/min (ref >=60)
Glucose, Bld: 87 mg/dL (ref 65–99)
POTASSIUM: 4.3 mmol/L (ref 3.5–5.3)
SODIUM: 139 mmol/L (ref 135–146)
Total Bilirubin: 0.6 mg/dL (ref 0.2–1.2)
Total Protein: 6.7 g/dL (ref 6.1–8.1)

## 2014-11-03 LAB — LIPID PANEL
CHOL/HDL RATIO: 3.2 ratio (ref <=5.0)
Cholesterol: 133 mg/dL (ref 125–200)
HDL: 41 mg/dL (ref >=40)
LDL Cholesterol: 66 mg/dL (ref <130)
TRIGLYCERIDES: 132 mg/dL (ref <150)
VLDL: 26 mg/dL (ref <30)

## 2014-11-04 LAB — HIV-1 RNA QUANT-NO REFLEX-BLD: HIV-1 RNA Quant, Log: <1.3 {Log} (ref <1.30)

## 2014-11-16 ENCOUNTER — Ambulatory Visit: Payer: No Typology Code available for payment source | Admitting: Infectious Disease

## 2014-11-23 ENCOUNTER — Ambulatory Visit (INDEPENDENT_AMBULATORY_CARE_PROVIDER_SITE_OTHER): Payer: No Typology Code available for payment source | Admitting: Infectious Disease

## 2014-11-23 ENCOUNTER — Encounter: Payer: Self-pay | Admitting: Infectious Disease

## 2014-11-23 VITALS — BP 151/89 | HR 63 | Temp 97.5°F | Wt 187.0 lb

## 2014-11-23 DIAGNOSIS — Z720 Tobacco use: Secondary | ICD-10-CM | POA: Diagnosis not present

## 2014-11-23 DIAGNOSIS — R030 Elevated blood-pressure reading, without diagnosis of hypertension: Secondary | ICD-10-CM | POA: Diagnosis not present

## 2014-11-23 DIAGNOSIS — B20 Human immunodeficiency virus [HIV] disease: Secondary | ICD-10-CM | POA: Diagnosis not present

## 2014-11-23 DIAGNOSIS — Z23 Encounter for immunization: Secondary | ICD-10-CM

## 2014-11-23 DIAGNOSIS — IMO0001 Reserved for inherently not codable concepts without codable children: Secondary | ICD-10-CM

## 2014-11-23 DIAGNOSIS — F17200 Nicotine dependence, unspecified, uncomplicated: Secondary | ICD-10-CM | POA: Insufficient documentation

## 2014-11-23 HISTORY — DX: Nicotine dependence, unspecified, uncomplicated: F17.200

## 2014-11-23 NOTE — Progress Notes (Signed)
Subjective:    Patient ID: Darrell [name], male    DOB: [birth year], 24 y.o.   MRN: [id number]  HPI     Review of Systems  Constitutional: Negative for fever, chills, diaphoresis, activity change, appetite change, fatigue and unexpected weight change.  HENT: Negative for congestion, rhinorrhea, sinus pressure, sneezing, sore throat and trouble swallowing.   Eyes: Negative for photophobia and visual disturbance.  Respiratory: Negative for cough, chest tightness, shortness of breath, wheezing and stridor.   Cardiovascular: Negative for chest pain, palpitations and leg swelling.  Gastrointestinal: Negative for nausea, vomiting, abdominal pain, diarrhea, constipation, blood in stool, abdominal distention and anal bleeding.  Genitourinary: Negative for dysuria, hematuria, flank pain and difficulty urinating.  Musculoskeletal: Negative for myalgias, back pain, joint swelling, arthralgias and gait problem.  Skin: Negative for color change, pallor, rash and wound.  Neurological: Negative for dizziness, tremors, weakness and light-headedness.  Hematological: Negative for adenopathy. Does not bruise/bleed easily.  Psychiatric/Behavioral: Negative for behavioral problems, confusion, sleep disturbance, dysphoric mood, decreased concentration and agitation.     Subjective:    Patient ID: Darrell [name], male    DOB: Mar 27, 1990, 24 y.o.   MRN: [id number]  HPI   24 year old [place] American man with HIV, who tested positive in July after negative test in June of 2015. He has HIV + partner. His VL at intake was in the 300s and CD4 above 400.  No resistance data back. HLA b701 negative.  Since then he has fully suppress his virus on TRIUMEQ and maintains a healthy CD4 count.    Lab Results  Component Value Date   HIV1RNAQUANT <20 11/03/2014   Lab Results  Component Value Date   CD4TABS 830 09/01/2014   CD4TABS 830 05/04/2014   CD4TABS 910 01/21/2014   He is trying to cut down on  small cigarettes and we counselled him re smoking cessation.   Active Ambulatory Problems    Diagnosis Date Noted  . HEADACHE, TENSION 11/09/2006  . PHARYNGITIS, ACUTE 12/18/2006  . URI 11/09/2006  . RHINITIS, ALLERGIC NEC 11/09/2006  . HIV disease ([place]) 10/08/2013  . Elevated blood pressure 05/20/2014  . Screen for STD (sexually transmitted disease) 09/09/2014   Resolved Ambulatory Problems    Diagnosis Date Noted  . No Resolved Ambulatory Problems   Past Medical History  Diagnosis Date  . HIV infection ([facility])           Objective:   Physical Exam  Constitutional: He is oriented to person, place, and time. He appears well-developed and well-nourished.  HENT:  Head: Normocephalic and atraumatic.  Eyes: Conjunctivae and EOM are normal.  Neck: Normal range of motion. Neck supple.  Cardiovascular: Normal rate and regular rhythm.   Pulmonary/Chest: Effort normal. No respiratory distress. He has no wheezes.  Abdominal: Soft. He exhibits no distension.  Musculoskeletal: Normal range of motion. He exhibits no edema or tenderness.  Neurological: He is alert and oriented to person, place, and time.  Skin: Skin is warm and dry. No rash noted. No erythema. No pallor.  Psychiatric: He has a normal mood and affect. His behavior is normal. Judgment and thought content normal.          Assessment & Plan:   HIV: perfectly controlled. RTC In 4 months   Pre-HTN: BP up at times. Pt to keep track of this, a degree of white coat HTN also part of this  Smoking cessation: we spent greater than 3 minutes counseling pt to stop smoking

## 2015-03-17 ENCOUNTER — Telehealth: Payer: Self-pay | Admitting: *Deleted

## 2015-03-17 NOTE — Telephone Encounter (Signed)
PA for Triumeq - completed and sent.

## 2015-03-22 ENCOUNTER — Other Ambulatory Visit: Payer: Self-pay | Admitting: *Deleted

## 2015-03-22 DIAGNOSIS — B20 Human immunodeficiency virus [HIV] disease: Secondary | ICD-10-CM

## 2015-03-22 MED ORDER — ABACAVIR-DOLUTEGRAVIR-LAMIVUD 600-50-300 MG PO TABS: 1.0000 | ORAL_TABLET | Freq: Every day | ORAL | Status: DC

## 2015-03-22 NOTE — Telephone Encounter (Signed)
PA approved, pharmacy notified.  Will notifiy pt.

## 2015-03-22 NOTE — Telephone Encounter (Signed)
Thanks so much Denise 

## 2015-03-22 NOTE — Telephone Encounter (Signed)
PA approved for Triumeq.  Refill sent to pharmacy.

## 2015-03-24 ENCOUNTER — Ambulatory Visit (INDEPENDENT_AMBULATORY_CARE_PROVIDER_SITE_OTHER): Payer: PRIVATE HEALTH INSURANCE | Admitting: Infectious Disease

## 2015-03-24 ENCOUNTER — Encounter: Payer: Self-pay | Admitting: Infectious Disease

## 2015-03-24 VITALS — BP 127/82 | HR 63 | Temp 97.6°F | Ht 70.0 in | Wt 186.0 lb

## 2015-03-24 DIAGNOSIS — F17200 Nicotine dependence, unspecified, uncomplicated: Secondary | ICD-10-CM

## 2015-03-24 DIAGNOSIS — R030 Elevated blood-pressure reading, without diagnosis of hypertension: Secondary | ICD-10-CM | POA: Diagnosis not present

## 2015-03-24 DIAGNOSIS — B20 Human immunodeficiency virus [HIV] disease: Secondary | ICD-10-CM

## 2015-03-24 DIAGNOSIS — Z720 Tobacco use: Secondary | ICD-10-CM

## 2015-03-24 DIAGNOSIS — IMO0001 Reserved for inherently not codable concepts without codable children: Secondary | ICD-10-CM

## 2015-03-24 NOTE — Progress Notes (Signed)
Chief complaint: Follow up for HIV on medications  Subjective:    Patient ID: Darrell [name], male    DOB: Mar 26, 1990, 25 y.o.   MRN: [id number]  HPI   25 year old [place] American man with HIV, who tested positive in July after negative test in June of 2015. He has HIV + partner. His VL at intake was in the 300s and CD4 above 400.  No resistance data back. HLA b701 negative.Since then he has fully suppress his virus on TRIUMEQ and maintains a healthy CD4 count. He did change jobs and lost insurance temporarily and went without medications for roughly 1 month. He is back on his [place] for the past week.  Past Medical History  Diagnosis Date  . HIV infection ([facility])   . Screen for STD (sexually transmitted disease) 09/09/2014  . Smoker 11/23/2014    No past surgical history on file.  Family History  Problem Relation Age of Onset  . Cancer Mother   . Fibromyalgia Mother   . Hypertension Mother       Social History   Social History  . Marital Status: Single    Spouse Name: N/A  . Number of Children: N/A  . Years of Education: N/A   Social History Main Topics  . Smoking status: Light Tobacco Smoker  . Smokeless tobacco: Never Used     Comment: 4 x weekly  . Alcohol Use: 0.0 oz/week    0 Standard drinks or equivalent per week     Comment: occasional  . Drug Use: 7.00 per week    Special: Marijuana  . Sexual Activity:    Partners: Male     Comment: pt given condoms   Other Topics Concern  . None   Social History Narrative      Review of Systems  Constitutional: Negative for fever, chills, diaphoresis, activity change, appetite change, fatigue and unexpected weight change.  HENT: Negative for congestion, rhinorrhea, sinus pressure, sneezing, sore throat and trouble swallowing.   Eyes: Negative for photophobia and visual disturbance.  Respiratory: Negative for cough, chest tightness, shortness of breath, wheezing and stridor.   Cardiovascular: Negative for  chest pain, palpitations and leg swelling.  Gastrointestinal: Negative for nausea, vomiting, abdominal pain, diarrhea, constipation, blood in stool, abdominal distention and anal bleeding.  Genitourinary: Negative for dysuria, hematuria, flank pain and difficulty urinating.  Musculoskeletal: Negative for myalgias, back pain, joint swelling, arthralgias and gait problem.  Skin: Negative for color change, pallor, rash and wound.  Neurological: Negative for dizziness, tremors, weakness and light-headedness.  Hematological: Negative for adenopathy. Does not bruise/bleed easily.  Psychiatric/Behavioral: Negative for behavioral problems, confusion, sleep disturbance, dysphoric mood, decreased concentration and agitation.    HPI        Objective:   Physical Exam  Constitutional: He is oriented to person, place, and time. He appears well-developed and well-nourished.  HENT:  Head: Normocephalic and atraumatic.  Eyes: Conjunctivae and EOM are normal.  Neck: Normal range of motion. Neck supple.  Cardiovascular: Normal rate and regular rhythm.   Pulmonary/Chest: Effort normal. No respiratory distress. He has no wheezes.  Abdominal: Soft. He exhibits no distension.  Musculoskeletal: Normal range of motion. He exhibits no edema or tenderness.  Neurological: He is alert and oriented to person, place, and time.  Skin: Skin is warm and dry. No rash noted. No erythema. No pallor.  Psychiatric: He has a normal mood and affect. His behavior is normal. Judgment and thought content normal.  Assessment & Plan:   HIV: Recheck labs in 2 months time with follow-up visit with me afterwards.   Pre-HTN: BP up at times. Pt to keep track of this, a degree of white coat HTN also part of this  Smoking cessation: Continue work on smoking cessation   I spent greater than 25 minutes with the patient including greater than 50% of time in face to face counsel of the patient's HIV, his pre-HTN and his  smoking and in coordination of his care. Marland Kitchen

## 2015-04-30 ENCOUNTER — Other Ambulatory Visit: Payer: Self-pay | Admitting: *Deleted

## 2015-04-30 DIAGNOSIS — B20 Human immunodeficiency virus [HIV] disease: Secondary | ICD-10-CM

## 2015-04-30 MED ORDER — ABACAVIR-DOLUTEGRAVIR-LAMIVUD 600-50-300 MG PO TABS: 1.0000 | ORAL_TABLET | Freq: Every day | ORAL | Status: DC

## 2015-06-07 ENCOUNTER — Encounter: Payer: Self-pay | Admitting: Infectious Disease

## 2015-06-07 ENCOUNTER — Ambulatory Visit (INDEPENDENT_AMBULATORY_CARE_PROVIDER_SITE_OTHER): Payer: PRIVATE HEALTH INSURANCE | Admitting: Infectious Disease

## 2015-06-07 VITALS — BP 130/78 | HR 65 | Temp 98.1°F | Wt 189.0 lb

## 2015-06-07 DIAGNOSIS — B20 Human immunodeficiency virus [HIV] disease: Secondary | ICD-10-CM | POA: Diagnosis not present

## 2015-06-07 DIAGNOSIS — R030 Elevated blood-pressure reading, without diagnosis of hypertension: Secondary | ICD-10-CM | POA: Diagnosis not present

## 2015-06-07 DIAGNOSIS — Z720 Tobacco use: Secondary | ICD-10-CM | POA: Diagnosis not present

## 2015-06-07 DIAGNOSIS — F17200 Nicotine dependence, unspecified, uncomplicated: Secondary | ICD-10-CM

## 2015-06-07 NOTE — Progress Notes (Signed)
Chief complaint: Follow up for HIV on medications  Subjective:    Patient ID: Darrell [name], male    DOB: Nov 20, 1990, 25 y.o.   MRN: [id number]  HPI   25 year old [place] American man with HIV, who tested positive in July 2015 after negative test in June of 2015. He has HIV + partner. His VL at intake was in the 300s and CD4 above 400.  No resistance data back. HLA b701 negative.Since then he has fully suppress his virus on TRIUMEQ and maintains a healthy CD4 count. He did change jobs and lost insurance temporarily and went without medications for roughly 1 month. He is back on his [place] for one week prior to my last visit with him. Now he has been on [place] for 2 straight months and he is ready to have his labs rechecked.  Past Medical History  Diagnosis Date  . HIV infection ([facility])   . Screen for STD (sexually transmitted disease) 09/09/2014  . Smoker 11/23/2014    No past surgical history on file.  Family History  Problem Relation Age of Onset  . Cancer Mother   . Fibromyalgia Mother   . Hypertension Mother       Social History   Social History  . Marital Status: Single    Spouse Name: N/A  . Number of Children: N/A  . Years of Education: N/A   Social History Main Topics  . Smoking status: Light Tobacco Smoker  . Smokeless tobacco: Never Used     Comment: 4 x weekly  . Alcohol Use: 0.0 oz/week    0 Standard drinks or equivalent per week     Comment: occasional  . Drug Use: 7.00 per week    Special: Marijuana  . Sexual Activity:    Partners: Male     Comment: pt given condoms   Other Topics Concern  . None   Social History Narrative      Review of Systems  Constitutional: Negative for fever, chills, diaphoresis, activity change, appetite change, fatigue and unexpected weight change.  HENT: Negative for congestion, rhinorrhea, sinus pressure, sneezing, sore throat and trouble swallowing.   Eyes: Negative for photophobia and visual disturbance.    Respiratory: Negative for cough, chest tightness, shortness of breath, wheezing and stridor.   Cardiovascular: Negative for chest pain, palpitations and leg swelling.  Gastrointestinal: Negative for nausea, vomiting, abdominal pain, diarrhea, constipation, blood in stool, abdominal distention and anal bleeding.  Genitourinary: Negative for dysuria, hematuria, flank pain and difficulty urinating.  Musculoskeletal: Negative for myalgias, back pain, joint swelling, arthralgias and gait problem.  Skin: Negative for color change, pallor, rash and wound.  Neurological: Negative for dizziness, tremors, weakness and light-headedness.  Hematological: Negative for adenopathy. Does not bruise/bleed easily.  Psychiatric/Behavioral: Negative for behavioral problems, confusion, sleep disturbance, dysphoric mood, decreased concentration and agitation.           Objective:   Physical Exam  Constitutional: He is oriented to person, place, and time. He appears well-developed and well-nourished.  HENT:  Head: Normocephalic and atraumatic.  Eyes: Conjunctivae and EOM are normal.  Neck: Normal range of motion. Neck supple.  Cardiovascular: Normal rate and regular rhythm.   Pulmonary/Chest: Effort normal. No respiratory distress. He has no wheezes.  Abdominal: Soft. He exhibits no distension.  Musculoskeletal: Normal range of motion. He exhibits no edema or tenderness.  Neurological: He is alert and oriented to person, place, and time.  Skin: Skin is warm and dry. No rash  noted. No erythema. No pallor.  Psychiatric: He has a normal mood and affect. His behavior is normal. Judgment and thought content normal.        Assessment & Plan:   HIV: Recheck labs in today andfollow-up visit with me 6 months time   Pre-HTN: BP up at times. Pt to keep track of this, a degree of white coat HTN also part of this  Smoking cessation: Continue work on smoking cessation and has cut down further and increased  exercise   .

## 2015-06-08 LAB — CBC WITH DIFFERENTIAL/PLATELET
BASOS ABS: 0 {cells}/uL (ref 0–200)
Basophils Relative: 0 %
EOS PCT: 2 %
Eosinophils Absolute: 76 cells/uL (ref 15–500)
HCT: 42.3 % (ref 38.5–50.0)
Hemoglobin: 14.2 g/dL (ref 13.2–17.1)
LYMPHS ABS: 1558 {cells}/uL (ref 850–3900)
LYMPHS PCT: 41 %
MCH: 29.7 pg (ref 27.0–33.0)
MCHC: 33.6 g/dL (ref 32.0–36.0)
MCV: 88.5 fL (ref 80.0–100.0)
MONOS PCT: 18 %
MPV: 8.3 fL (ref 7.5–12.5)
Monocytes Absolute: 684 cells/uL (ref 200–950)
NEUTROS PCT: 39 %
Neutro Abs: 1482 cells/uL — ABNORMAL LOW (ref 1500–7800)
PLATELETS: 299 10*3/uL (ref 140–400)
RBC: 4.78 MIL/uL (ref 4.20–5.80)
RDW: 13.7 % (ref 11.0–15.0)
WBC: 3.8 10*3/uL (ref 3.8–10.8)

## 2015-06-08 LAB — URINE CYTOLOGY ANCILLARY ONLY
CHLAMYDIA, DNA PROBE: NEGATIVE
Neisseria Gonorrhea: NEGATIVE

## 2015-06-08 LAB — COMPLETE METABOLIC PANEL WITH GFR
ALBUMIN: 4.1 g/dL (ref 3.6–5.1)
ALK PHOS: 64 U/L (ref 40–115)
ALT: 17 U/L (ref 9–46)
AST: 20 U/L (ref 10–40)
BILIRUBIN TOTAL: 0.6 mg/dL (ref 0.2–1.2)
BUN: 10 mg/dL (ref 7–25)
CALCIUM: 9 mg/dL (ref 8.6–10.3)
CO2: 24 mmol/L (ref 20–31)
CREATININE: 1.42 mg/dL — AB (ref 0.60–1.35)
Chloride: 106 mmol/L (ref 98–110)
GFR, Est African American: 78 mL/min (ref >=60)
GFR, Est Non African American: 68 mL/min (ref >=60)
GLUCOSE: 116 mg/dL — AB (ref 65–99)
POTASSIUM: 4.1 mmol/L (ref 3.5–5.3)
Sodium: 138 mmol/L (ref 135–146)
TOTAL PROTEIN: 7 g/dL (ref 6.1–8.1)

## 2015-06-08 LAB — T-HELPER CELL (CD4) - (RCID CLINIC ONLY)
CD4 % Helper T Cell: 36 % (ref 33–55)
CD4 T Cell Abs: 640 /uL (ref 400–2700)

## 2015-06-08 LAB — RPR TITER

## 2015-06-08 LAB — RPR: RPR Ser Ql: REACTIVE — AB

## 2015-06-09 LAB — FLUORESCENT TREPONEMAL AB(FTA)-IGG-BLD: FLUORESCENT TREPONEMAL ABS: REACTIVE — AB

## 2015-06-11 LAB — HIV RNA, RTPCR W/R GT (RTI, PI,INT)

## 2015-08-02 ENCOUNTER — Other Ambulatory Visit: Payer: Self-pay

## 2015-08-02 ENCOUNTER — Emergency Department (HOSPITAL_COMMUNITY): Payer: PRIVATE HEALTH INSURANCE

## 2015-08-02 ENCOUNTER — Encounter (HOSPITAL_COMMUNITY): Payer: Self-pay | Admitting: Emergency Medicine

## 2015-08-02 ENCOUNTER — Emergency Department (HOSPITAL_COMMUNITY)
Admission: EM | Admit: 2015-08-02 | Discharge: 2015-08-02 | Disposition: A | Discharge status: Home / Self Care | Payer: PRIVATE HEALTH INSURANCE | Attending: Emergency Medicine | Admitting: Emergency Medicine

## 2015-08-02 DIAGNOSIS — R0602 Shortness of breath: Secondary | ICD-10-CM | POA: Insufficient documentation

## 2015-08-02 DIAGNOSIS — F17200 Nicotine dependence, unspecified, uncomplicated: Secondary | ICD-10-CM | POA: Diagnosis not present

## 2015-08-02 DIAGNOSIS — R0789 Other chest pain: Secondary | ICD-10-CM | POA: Diagnosis not present

## 2015-08-02 LAB — BASIC METABOLIC PANEL
ANION GAP: 4 — AB (ref 5–15)
BUN: 10 mg/dL (ref 6–20)
CALCIUM: 9.4 mg/dL (ref 8.9–10.3)
CHLORIDE: 107 mmol/L (ref 101–111)
CO2: 27 mmol/L (ref 22–32)
CREATININE: 1.32 mg/dL — AB (ref 0.61–1.24)
GFR calc non Af Amer: >60 mL/min (ref >60)
GLUCOSE: 96 mg/dL (ref 65–99)
Potassium: 4.4 mmol/L (ref 3.5–5.1)
Sodium: 138 mmol/L (ref 135–145)

## 2015-08-02 LAB — CBC
HCT: 44.1 % (ref 39.0–52.0)
HEMOGLOBIN: 15.1 g/dL (ref 13.0–17.0)
MCH: 30.5 pg (ref 26.0–34.0)
MCHC: 34.2 g/dL (ref 30.0–36.0)
MCV: 89.1 fL (ref 78.0–100.0)
Platelets: 279 10*3/uL (ref 150–400)
RBC: 4.95 MIL/uL (ref 4.22–5.81)
RDW: 13.2 % (ref 11.5–15.5)
WBC: 3.7 10*3/uL — ABNORMAL LOW (ref 4.0–10.5)

## 2015-08-02 LAB — I-STAT TROPONIN, ED: TROPONIN I, POC: 0 ng/mL (ref 0.00–0.08)

## 2015-08-02 LAB — D-DIMER, QUANTITATIVE: D-Dimer, Quant: <0.27 ug/mL-FEU (ref 0.00–0.50)

## 2015-08-02 MED ORDER — IBUPROFEN 800 MG PO TABS: 800.0000 mg | ORAL_TABLET | Freq: Three times a day (TID) | ORAL | Status: DC

## 2015-08-02 MED ORDER — IBUPROFEN 800 MG PO TABS
800.0000 mg | ORAL_TABLET | Freq: Once | ORAL | Status: AC
  Administered 2015-08-02: 800 mg via ORAL
  Filled 2015-08-02: qty 1

## 2015-08-02 NOTE — ED Provider Notes (Signed)
CSN: [id number]     Arrival date & time 08/02/15  1558 History   First MD Initiated Contact with Patient 08/02/15 2058     Chief Complaint  Patient presents with  . Chest Pain     (Consider location/radiation/quality/duration/timing/severity/associated sxs/prior Treatment) HPI Comments: Patient with a history of HIV (Viral load <20) presents with left upper chest pain that started last night and is associated with tingling into the left arm. It is worse with movement and causes mild SOB. No cough or fever. No history of DVT/PE. No injury to chest wall. He has been taking Tylenol at home without relief. No recent travel or sedentary periods.   Patient is a 25 y.o. male presenting with chest pain. The history is provided by the patient. No language interpreter was used.  Chest Pain Pain location:  L chest Pain radiates to the back: no   Pain severity:  Mild Duration:  1 day Timing:  Constant Chronicity:  New Relieved by:  Nothing Worsened by:  Deep breathing and movement Ineffective treatments: Tylenol. Associated symptoms: shortness of breath   Associated symptoms: no abdominal pain, no cough, no fever, no nausea and not vomiting     Past Medical History  Diagnosis Date  . HIV infection ([facility])   . Screen for STD (sexually transmitted disease) 09/09/2014  . Smoker 11/23/2014   History reviewed. No pertinent past surgical history. Family History  Problem Relation Age of Onset  . Cancer Mother   . Fibromyalgia Mother   . Hypertension Mother    Social History  Substance Use Topics  . Smoking status: Light Tobacco Smoker  . Smokeless tobacco: Never Used     Comment: 4 x weekly  . Alcohol Use: 0.0 oz/week    0 Standard drinks or equivalent per week     Comment: occasional    Review of Systems  Constitutional: Negative for fever and chills.  HENT: Negative.  Negative for congestion and sore throat.   Respiratory: Positive for shortness of breath. Negative for cough.    Cardiovascular: Positive for chest pain.  Gastrointestinal: Negative.  Negative for nausea, vomiting and abdominal pain.  Musculoskeletal: Negative.   Skin: Negative.  Negative for wound.  Neurological: Negative.       Allergies  Sulfonamide derivatives  Home Medications   Prior to Admission medications   Medication Sig Start Date End Date Taking? Authorizing Provider  abacavir-dolutegravir-lamiVUDine (TRIUMEQ) 600-50-300 MG tablet Take 1 tablet by mouth daily. 04/30/15  Yes Randall Hiss, MD  acetaminophen (TYLENOL) 500 MG tablet Take 500 mg by mouth every 6 (six) hours as needed for moderate pain.   Yes Historical Provider, MD   BP 135/87 mmHg  Pulse 102  Temp(Src) 98.1 F (36.7 C) (Oral)  Resp 18  SpO2 99% Physical Exam  Constitutional: He is oriented to person, place, and time. He appears well-developed and well-nourished.  HENT:  Head: Normocephalic.  Neck: Normal range of motion. Neck supple.  Cardiovascular: Normal rate and regular rhythm.   Pulmonary/Chest: Effort normal and breath sounds normal. He has no wheezes. He has no rales. He exhibits tenderness (Left chest wall tenderness. ).  Abdominal: Soft. Bowel sounds are normal. There is no tenderness. There is no rebound and no guarding.  Musculoskeletal: Normal range of motion.  Neurological: He is alert and oriented to person, place, and time.  Skin: Skin is warm and dry. No rash noted.  Psychiatric: He has a normal mood and affect.    ED Course  Procedures (including critical care time) Labs Review Labs Reviewed  BASIC METABOLIC PANEL - Abnormal; Notable for the following:    Creatinine, Ser 1.32 (*)    Anion gap 4 (*)    All other components within normal limits  CBC - Abnormal; Notable for the following:    WBC 3.7 (*)    All other components within normal limits  I-STAT TROPOININ, ED   Results for orders placed or performed during the hospital encounter of 08/02/15  Basic metabolic panel   Result Value Ref Range   Sodium 138 135 - 145 mmol/L   Potassium 4.4 3.5 - 5.1 mmol/L   Chloride 107 101 - 111 mmol/L   CO2 27 22 - 32 mmol/L   Glucose, Bld 96 65 - 99 mg/dL   BUN 10 6 - 20 mg/dL   Creatinine, Ser 1.611.32 (H) 0.61 - 1.24 mg/dL   Calcium 9.4 8.9 - 09.610.3 mg/dL   GFR calc non Af Amer >60 >60 mL/min   GFR calc Af Amer >60 >60 mL/min   Anion gap 4 (L) 5 - 15  CBC  Result Value Ref Range   WBC 3.7 (L) 4.0 - 10.5 K/uL   RBC 4.95 4.22 - 5.81 MIL/uL   Hemoglobin 15.1 13.0 - 17.0 g/dL   HCT 04.544.1 40.939.0 - 81.152.0 %   MCV 89.1 78.0 - 100.0 fL   MCH 30.5 26.0 - 34.0 pg   MCHC 34.2 30.0 - 36.0 g/dL   RDW 91.413.2 78.211.5 - 95.615.5 %   Platelets 279 150 - 400 K/uL  D-dimer, quantitative (not at [facility])  Result Value Ref Range   D-Dimer, Quant <0.27 0.00 - 0.50 ug/mL-FEU  I-stat troponin, ED  Result Value Ref Range   Troponin i, poc 0.00 0.00 - 0.08 ng/mL   Comment 3           Dg Chest 2 View  08/02/2015  CLINICAL DATA:  Pt complains of left sided chest pain/tightness that's causing tingling down left arm also with some SOB since last night. Some light headedness also. Occasional smoker. SHielded pt. EXAM: CHEST  2 VIEW COMPARISON:  None. FINDINGS: Midline trachea.  Normal heart size and mediastinal contours. Sharp costophrenic angles.  No pneumothorax.  Clear lungs. IMPRESSION: No active cardiopulmonary disease. Electronically Signed   By: Jeronimo GreavesKyle  Talbot M.D.   On: 08/02/2015 16:54     Imaging Review Dg Chest 2 View  08/02/2015  CLINICAL DATA:  Pt complains of left sided chest pain/tightness that's causing tingling down left arm also with some SOB since last night. Some light headedness also. Occasional smoker. SHielded pt. EXAM: CHEST  2 VIEW COMPARISON:  None. FINDINGS: Midline trachea.  Normal heart size and mediastinal contours. Sharp costophrenic angles.  No pneumothorax.  Clear lungs. IMPRESSION: No active cardiopulmonary disease. Electronically Signed   By: Jeronimo GreavesKyle  Talbot M.D.   On:  08/02/2015 16:54   I have personally reviewed and evaluated these images and lab results as part of my medical decision-making.   EKG Interpretation None      MDM   Final diagnoses:  None    1. Chest wall pain  Patient presents with left upper chest pain for the past one day, no injury. Pain is reproducible. Labs, including d-dimer are negative, CXR clear of infection. Likely chest wall pain. Ibuprofen provided with improvement. VSS, no hypoxia. He is felt stable for discharge home.  Elpidio AnisShari Pilar Westergaard, PA-C 08/02/15 2308  Nelva Nayobert Beaton, MD 08/04/15 [phone]

## 2015-08-02 NOTE — Discharge Instructions (Signed)
Chest Wall Pain °Chest wall pain is pain in or around the bones and muscles of your chest. Sometimes, an injury causes this pain. Sometimes, the cause may not be known. This pain may take several weeks or longer to get better. °HOME CARE INSTRUCTIONS  °Pay attention to any changes in your symptoms. Take these actions to help with your pain:  °· Rest as told by your health care provider.   °· Avoid activities that cause pain. These include any activities that use your chest muscles or your abdominal and side muscles to lift heavy items.    °· If directed, apply ice to the painful area: °· Put ice in a plastic bag. °· Place a towel between your skin and the bag. °· Leave the ice on for 20 minutes, 2-3 times per day. °· Take over-the-counter and prescription medicines only as told by your health care provider. °· Do not use tobacco products, including cigarettes, chewing tobacco, and e-cigarettes. If you need help quitting, ask your health care provider. °· Keep all follow-up visits as told by your health care provider. This is important. °SEEK MEDICAL CARE IF: °· You have a fever. °· Your chest pain becomes worse. °· You have new symptoms. °SEEK IMMEDIATE MEDICAL CARE IF: °· You have nausea or vomiting. °· You feel sweaty or light-headed. °· You have a cough with phlegm (sputum) or you cough up blood. °· You develop shortness of breath. °  °This information is not intended to replace advice given to you by your health care provider. Make sure you discuss any questions you have with your health care provider. °  °Document Released: 02/06/2005 Document Revised: 10/28/2014 Document Reviewed: 05/04/2014 °Elsevier Interactive Patient Education ©2016 [employer] °Heat Therapy °Heat therapy can help ease sore, stiff, injured, and tight muscles and joints. Heat relaxes your muscles, which may help ease your pain.  °RISKS AND COMPLICATIONS °If you have any of the following conditions, do not use heat therapy unless your  health care provider has approved: °· Poor circulation. °· Healing wounds or scarred skin in the area being treated. °· Diabetes, heart disease, or high blood pressure. °· Not being able to feel (numbness) the area being treated. °· Unusual swelling of the area being treated. °· Active infections. °· Blood clots. °· Cancer. °· Inability to communicate pain. This may include young children and people who have problems with their brain function (dementia). °· Pregnancy. °Heat therapy should only be used on old, pre-existing, or long-lasting (chronic) injuries. Do not use heat therapy on new injuries unless directed by your health care provider. °HOW TO USE HEAT THERAPY °There are several different kinds of heat therapy, including: °· Moist heat pack. °· Warm water bath. °· Hot water bottle. °· Electric heating pad. °· Heated gel pack. °· Heated wrap. °· Electric heating pad. °Use the heat therapy method suggested by your health care provider. Follow your health care provider's instructions on when and how to use heat therapy. °GENERAL HEAT THERAPY RECOMMENDATIONS °· Do not sleep while using heat therapy. Only use heat therapy while you are awake. °· Your skin may turn pink while using heat therapy. Do not use heat therapy if your skin turns red. °· Do not use heat therapy if you have new pain. °· High heat or long exposure to heat can cause burns. Be careful when using heat therapy to avoid burning your skin. °· Do not use heat therapy on areas of your skin that are already irritated, such as with a   rash or sunburn. °SEEK MEDICAL CARE IF: °· You have blisters, redness, swelling, or numbness. °· You have new pain. °· Your pain is worse. °MAKE SURE YOU: °· Understand these instructions. °· Will watch your condition. °· Will get help right away if you are not doing well or get worse. °  °This information is not intended to replace advice given to you by your health care provider. Make sure you discuss any questions you  have with your health care provider. °  °Document Released: 05/01/2011 Document Revised: 02/27/2014 Document Reviewed: 04/01/2013 °Elsevier Interactive Patient Education ©2016 [employer] ° °

## 2015-08-02 NOTE — ED Notes (Signed)
Pt states that he has had L sided chest pain since last night. Alert and oriented. States he has been more stressed than normal lately.

## 2015-12-22 ENCOUNTER — Other Ambulatory Visit: Payer: Self-pay | Admitting: *Deleted

## 2015-12-22 DIAGNOSIS — B20 Human immunodeficiency virus [HIV] disease: Secondary | ICD-10-CM

## 2015-12-22 MED ORDER — ABACAVIR-DOLUTEGRAVIR-LAMIVUD 600-50-300 MG PO TABS: 1.0000 | ORAL_TABLET | Freq: Every day | ORAL | 6 refills | Status: DC

## 2015-12-24 MED FILL — TRIUMEQ 600-50-300 MG TABS: 600-50-300 | 30 days supply | Qty: 30 | Fill #0

## 2015-12-28 ENCOUNTER — Encounter (HOSPITAL_COMMUNITY): Payer: Self-pay | Admitting: *Deleted

## 2015-12-28 ENCOUNTER — Ambulatory Visit (HOSPITAL_COMMUNITY)
Admission: EM | Admit: 2015-12-28 | Discharge: 2015-12-28 | Disposition: A | Discharge status: Home / Self Care | Payer: 59 | Attending: Family Medicine | Admitting: Family Medicine

## 2015-12-28 DIAGNOSIS — F17200 Nicotine dependence, unspecified, uncomplicated: Secondary | ICD-10-CM | POA: Insufficient documentation

## 2015-12-28 DIAGNOSIS — Z79899 Other long term (current) drug therapy: Secondary | ICD-10-CM | POA: Insufficient documentation

## 2015-12-28 DIAGNOSIS — J020 Streptococcal pharyngitis: Secondary | ICD-10-CM | POA: Diagnosis not present

## 2015-12-28 DIAGNOSIS — J029 Acute pharyngitis, unspecified: Secondary | ICD-10-CM | POA: Diagnosis not present

## 2015-12-28 LAB — POCT RAPID STREP A: STREPTOCOCCUS, GROUP A SCREEN (DIRECT): NEGATIVE

## 2015-12-28 MED ORDER — CEFDINIR 300 MG PO CAPS: 300.0000 mg | ORAL_CAPSULE | Freq: Two times a day (BID) | ORAL | 0 refills | Status: DC

## 2015-12-28 MED FILL — CEFDINIR 300 MG CAPSULE: 300 | 10 days supply | Qty: 20 | Fill #0

## 2015-12-28 NOTE — ED Provider Notes (Signed)
[facility]    CSN: [id number] Arrival date & time: 12/28/15  1525     History   Chief Complaint Chief Complaint  Patient presents with  . Sore Throat    HPI Darrell [name] is a 25 y.o. male.   The history is provided by the patient.  Sore Throat  This is a new problem. The current episode started 6 to 12 hours ago. The problem has been gradually worsening. Pertinent negatives include no chest pain, no abdominal pain and no shortness of breath. The symptoms are aggravated by swallowing. Nothing relieves the symptoms.    Past Medical History:  Diagnosis Date  . HIV infection (HCC)   . Screen for STD (sexually transmitted disease) 09/09/2014  . Smoker 11/23/2014    Patient Active Problem List   Diagnosis Date Noted  . Smoker 11/23/2014  . Screen for STD (sexually transmitted disease) 09/09/2014  . Elevated blood pressure 05/20/2014  . HIV disease (HCC) 10/08/2013  . PHARYNGITIS, ACUTE 12/18/2006  . HEADACHE, TENSION 11/09/2006  . URI 11/09/2006  . RHINITIS, ALLERGIC NEC 11/09/2006    History reviewed. No pertinent surgical history.     Home Medications    Prior to Admission medications   Medication Sig Start Date End Date Taking? Authorizing Provider  abacavir-dolutegravir-lamiVUDine (TRIUMEQ) 600-50-300 MG tablet Take 1 tablet by mouth daily. 12/22/15   Randall Hissornelius N Van Dam, MD  acetaminophen (TYLENOL) 500 MG tablet Take 500 mg by mouth every 6 (six) hours as needed for moderate pain.    Historical Provider, MD  ibuprofen (ADVIL,MOTRIN) 800 MG tablet Take 1 tablet (800 mg total) by mouth 3 (three) times daily. 08/02/15   Elpidio AnisShari Upstill, PA-C    Family History Family History  Problem Relation Age of Onset  . Cancer Mother   . Fibromyalgia Mother   . Hypertension Mother     Social History Social History  Substance Use Topics  . Smoking status: Light Tobacco Smoker  . Smokeless tobacco: Never Used     Comment: 4 x weekly  . Alcohol use 0.0  oz/week     Comment: occasional     Allergies   Sulfonamide derivatives   Review of Systems Review of Systems  Constitutional: Positive for fatigue and fever.  HENT: Positive for sore throat. Negative for congestion, postnasal drip and rhinorrhea.   Respiratory: Negative.  Negative for shortness of breath.   Cardiovascular: Negative.  Negative for chest pain.  Gastrointestinal: Negative.  Negative for abdominal pain.  All other systems reviewed and are negative.    Physical Exam Triage Vital Signs ED Triage Vitals  Enc Vitals Group     BP 12/28/15 1554 120/76     Pulse Rate 12/28/15 1554 92     Resp 12/28/15 1554 18     Temp 12/28/15 1554 99.6 F (37.6 C)     Temp Source 12/28/15 1554 Oral     SpO2 12/28/15 1554 99 %     Weight --      Height --      Head Circumference --      Peak Flow --      Pain Score 12/28/15 1552 7     Pain Loc --      Pain Edu? --      Excl. in GC? --    No data found.   Updated Vital Signs BP 120/76 (BP Location: Right Arm)   Pulse 92   Temp 99.6 F (37.6 C) (Oral)   Resp  18   SpO2 99%   Visual Acuity Right Eye Distance:   Left Eye Distance:   Bilateral Distance:    Right Eye Near:   Left Eye Near:    Bilateral Near:     Physical Exam  Constitutional: He is oriented to person, place, and time. He appears well-developed and well-nourished.  HENT:  Right Ear: External ear normal.  Left Ear: External ear normal.  Mouth/Throat: Oropharyngeal exudate present.  Neck: Normal range of motion. Neck supple.  Lymphadenopathy:    He has cervical adenopathy.  Neurological: He is alert and oriented to person, place, and time.  Skin: Skin is warm and dry.  Nursing note and vitals reviewed.    UC Treatments / Results  Labs (all labs ordered are listed, but only abnormal results are displayed) Labs Reviewed - No data to display  EKG  EKG Interpretation None       Radiology No results found.  Procedures Procedures  (including critical care time)  Medications Ordered in UC Medications - No data to display   Initial Impression / Assessment and Plan / UC Course  I have reviewed the triage vital signs and the nursing notes.  Pertinent labs & imaging results that were available during my care of the patient were reviewed by me and considered in my medical decision making (see chart for details).  Clinical Course       Final Clinical Impressions(s) / UC Diagnoses   Final diagnoses:  None    New Prescriptions New Prescriptions   No medications on file     Linna HoffJames D Estrellita Lasky, MD 12/28/15 1617

## 2015-12-28 NOTE — ED Triage Notes (Signed)
Pt  Reports    sorethroat      And   Swelling  Glands       [facility]    2  Days  A[facility]      Body  Aches        As  Well

## 2015-12-29 ENCOUNTER — Other Ambulatory Visit: Payer: PRIVATE HEALTH INSURANCE

## 2015-12-30 ENCOUNTER — Other Ambulatory Visit (HOSPITAL_COMMUNITY)
Admission: RE | Admit: 2015-12-30 | Discharge: 2015-12-30 | Disposition: A | Discharge status: Home / Self Care | Payer: 59 | Source: Ambulatory Visit | Attending: Infectious Disease | Admitting: Infectious Disease

## 2015-12-30 ENCOUNTER — Other Ambulatory Visit: Payer: 59

## 2015-12-30 DIAGNOSIS — B20 Human immunodeficiency virus [HIV] disease: Secondary | ICD-10-CM

## 2015-12-30 DIAGNOSIS — Z113 Encounter for screening for infections with a predominantly sexual mode of transmission: Secondary | ICD-10-CM | POA: Insufficient documentation

## 2015-12-30 LAB — CBC WITH DIFFERENTIAL/PLATELET
BASOS PCT: 0 %
Basophils Absolute: 0 cells/uL (ref 0–200)
EOS ABS: 165 {cells}/uL (ref 15–500)
EOS PCT: 3 %
HCT: 43.3 % (ref 38.5–50.0)
Hemoglobin: 14.9 g/dL (ref 13.2–17.1)
LYMPHS ABS: 1650 {cells}/uL (ref 850–3900)
Lymphocytes Relative: 30 %
MCH: 30.8 pg (ref 27.0–33.0)
MCHC: 34.4 g/dL (ref 32.0–36.0)
MCV: 89.5 fL (ref 80.0–100.0)
MONOS PCT: 18 %
MPV: 8.7 fL (ref 7.5–12.5)
Monocytes Absolute: 990 cells/uL — ABNORMAL HIGH (ref 200–950)
NEUTROS ABS: 2695 {cells}/uL (ref 1500–7800)
Neutrophils Relative %: 49 %
PLATELETS: 245 10*3/uL (ref 140–400)
RBC: 4.84 MIL/uL (ref 4.20–5.80)
RDW: 13.6 % (ref 11.0–15.0)
WBC: 5.5 10*3/uL (ref 3.8–10.8)

## 2015-12-30 LAB — COMPLETE METABOLIC PANEL WITH GFR
ALT: 20 U/L (ref 9–46)
AST: 23 U/L (ref 10–40)
Albumin: 4.1 g/dL (ref 3.6–5.1)
Alkaline Phosphatase: 46 U/L (ref 40–115)
BILIRUBIN TOTAL: 0.7 mg/dL (ref 0.2–1.2)
BUN: 11 mg/dL (ref 7–25)
CO2: 27 mmol/L (ref 20–31)
Calcium: 9.1 mg/dL (ref 8.6–10.3)
Chloride: 104 mmol/L (ref 98–110)
Creat: 1.47 mg/dL — ABNORMAL HIGH (ref 0.60–1.35)
GFR, EST AFRICAN AMERICAN: 75 mL/min (ref >=60)
GFR, EST NON AFRICAN AMERICAN: 64 mL/min (ref >=60)
Glucose, Bld: 124 mg/dL — ABNORMAL HIGH (ref 65–99)
POTASSIUM: 3.9 mmol/L (ref 3.5–5.3)
Sodium: 139 mmol/L (ref 135–146)
TOTAL PROTEIN: 6.9 g/dL (ref 6.1–8.1)

## 2015-12-30 LAB — CULTURE, GROUP A STREP (THRC)

## 2015-12-30 LAB — LIPID PANEL
Cholesterol: 132 mg/dL (ref <200)
HDL: 48 mg/dL (ref >40)
LDL Cholesterol: 67 mg/dL (ref <100)
TRIGLYCERIDES: 84 mg/dL (ref <150)
Total CHOL/HDL Ratio: 2.8 Ratio (ref <5.0)
VLDL: 17 mg/dL (ref <30)

## 2015-12-31 ENCOUNTER — Telehealth: Payer: Self-pay | Admitting: *Deleted

## 2015-12-31 LAB — RPR

## 2015-12-31 LAB — URINE CYTOLOGY ANCILLARY ONLY
Chlamydia: NEGATIVE
Neisseria Gonorrhea: NEGATIVE

## 2015-12-31 LAB — T-HELPER CELL (CD4) - (RCID CLINIC ONLY)
CD4 T CELL ABS: 630 /uL (ref 400–2700)
CD4 T CELL HELPER: 32 % — AB (ref 33–55)

## 2015-12-31 NOTE — Telephone Encounter (Signed)
Patient states he drinks "pretty much only water, about a gallon a day." He was taking 800 mg ibuprofen and antibiotics for strep throat when he had labs drawn. He will watch his hydration and take only Triumeq [facility]. Andree CossHowell, Romi Rathel M, RN

## 2015-12-31 NOTE — Telephone Encounter (Signed)
-----   Message from Randall Hissornelius N Van Dam, MD sent at 12/31/2015  8:52 AM EST ----- Please ask pt to hydrate vigorously prior to next appt since his cr went up rto 1,47

## 2015-12-31 NOTE — Telephone Encounter (Signed)
He should drink something like gatorade as well and dc nsaids

## 2015-12-31 NOTE — Telephone Encounter (Signed)
Patient will add gatorade, will stop all nsaids. Thanks!

## 2016-01-01 LAB — HIV-1 RNA QUANT-NO REFLEX-BLD

## 2016-01-10 ENCOUNTER — Ambulatory Visit: Payer: PRIVATE HEALTH INSURANCE | Admitting: Infectious Diseases

## 2016-01-11 ENCOUNTER — Encounter: Payer: Self-pay | Admitting: Infectious Diseases

## 2016-01-11 ENCOUNTER — Ambulatory Visit (INDEPENDENT_AMBULATORY_CARE_PROVIDER_SITE_OTHER): Payer: 59 | Admitting: Infectious Diseases

## 2016-01-11 VITALS — BP 126/76 | HR 82 | Temp 98.7°F | Ht 70.0 in | Wt 191.0 lb

## 2016-01-11 DIAGNOSIS — B20 Human immunodeficiency virus [HIV] disease: Secondary | ICD-10-CM | POA: Diagnosis not present

## 2016-01-11 DIAGNOSIS — Z113 Encounter for screening for infections with a predominantly sexual mode of transmission: Secondary | ICD-10-CM

## 2016-01-11 DIAGNOSIS — F17200 Nicotine dependence, unspecified, uncomplicated: Secondary | ICD-10-CM

## 2016-01-11 DIAGNOSIS — N2889 Other specified disorders of kidney and ureter: Secondary | ICD-10-CM

## 2016-01-11 DIAGNOSIS — N181 Chronic kidney disease, stage 1: Secondary | ICD-10-CM | POA: Diagnosis not present

## 2016-01-11 DIAGNOSIS — Z79899 Other long term (current) drug therapy: Secondary | ICD-10-CM

## 2016-01-11 MED ORDER — MENINGOCOCCAL A C Y&W-135 CONJ IM INJ: 0.5000 mL | INJECTION | Freq: Once | INTRAMUSCULAR | 0 refills | Status: AC

## 2016-01-11 NOTE — Addendum Note (Signed)
Addended by: Wendall MolaOCKERHAM, Dencil Cayson A on: 01/11/2016 02:30 PM   Modules accepted: Orders

## 2016-01-11 NOTE — Progress Notes (Signed)
   Subjective:    Patient ID: [name], male    DOB: 1990/12/24, 25 y.o.   MRN: [id number]  HPI 25 yo M with HIV dx July 2015. He has been maintained on triumeq since.  Feeling well, just left the gym.  Some concern that his Cr is increasing 1.47 <-- 1.32 <--- 1.42.  States he was drinking more etoh this summer.  His EGFR is still normal.   HIV 1 RNA Quant (copies/mL)  Date Value  12/30/2015 <20  06/07/2015 <20  11/03/2014 <20   CD4 T Cell Abs (/uL)  Date Value  12/30/2015 630  06/07/2015 640  09/01/2014 830     Review of Systems  Constitutional: Negative for appetite change and unexpected weight change.  Respiratory: Negative for shortness of breath.   Cardiovascular: Negative for chest pain and leg swelling.  Gastrointestinal: Negative for constipation and diarrhea.  Genitourinary: Negative for difficulty urinating and hematuria.  no foamy urine.      Objective:   Physical Exam  Constitutional: He appears well-developed and well-nourished.  HENT:  Mouth/Throat: No oropharyngeal exudate.  Eyes: EOM are normal. Pupils are equal, round, and reactive to light.  Neck: Neck supple.  Cardiovascular: Normal rate, regular rhythm and normal heart sounds.   Pulmonary/Chest: Effort normal and breath sounds normal.  Abdominal: Soft. Bowel sounds are normal. There is no tenderness. There is no rebound.  Musculoskeletal: He exhibits no edema.  Lymphadenopathy:    He has no cervical adenopathy.       Assessment & Plan:

## 2016-01-11 NOTE — Assessment & Plan Note (Signed)
Doing well.  Offered/refused condoms.  Has gotten flu shot Will given mening.  Will see back 6 months.

## 2016-01-11 NOTE — Assessment & Plan Note (Signed)
Quit tobacco.  Encouraged.  

## 2016-01-11 NOTE — Assessment & Plan Note (Signed)
Cr has been up slightly.  We spoke about this and this his EGFR was normal at last visit.  Discussed that he is on a renal sparing rx.  BP normal today.  Could consider renal eval and u/s if progresses.

## 2016-02-22 MED FILL — TRIUMEQ 600-50-300 MG TABS: 600-50-300 | 30 days supply | Qty: 30 | Fill #1 | Status: TO

## 2016-03-16 DIAGNOSIS — K602 Anal fissure, unspecified: Secondary | ICD-10-CM | POA: Diagnosis not present

## 2016-03-21 ENCOUNTER — Ambulatory Visit (INDEPENDENT_AMBULATORY_CARE_PROVIDER_SITE_OTHER): Payer: 59 | Admitting: Infectious Diseases

## 2016-03-21 ENCOUNTER — Encounter: Payer: Self-pay | Admitting: Infectious Diseases

## 2016-03-21 ENCOUNTER — Other Ambulatory Visit (HOSPITAL_COMMUNITY)
Admission: RE | Admit: 2016-03-21 | Discharge: 2016-03-21 | Disposition: A | Discharge status: Home / Self Care | Payer: 59 | Source: Ambulatory Visit | Attending: Infectious Diseases | Admitting: Infectious Diseases

## 2016-03-21 VITALS — BP 129/87 | HR 60 | Temp 97.2°F | Wt 193.0 lb

## 2016-03-21 DIAGNOSIS — B20 Human immunodeficiency virus [HIV] disease: Secondary | ICD-10-CM

## 2016-03-21 DIAGNOSIS — Z113 Encounter for screening for infections with a predominantly sexual mode of transmission: Secondary | ICD-10-CM | POA: Diagnosis not present

## 2016-03-21 DIAGNOSIS — N181 Chronic kidney disease, stage 1: Secondary | ICD-10-CM | POA: Diagnosis not present

## 2016-03-21 DIAGNOSIS — Z79899 Other long term (current) drug therapy: Secondary | ICD-10-CM

## 2016-03-21 LAB — COMPREHENSIVE METABOLIC PANEL
ALK PHOS: 50 U/L (ref 40–115)
ALT: 17 U/L (ref 9–46)
AST: 22 U/L (ref 10–40)
Albumin: 4.1 g/dL (ref 3.6–5.1)
BILIRUBIN TOTAL: 0.6 mg/dL (ref 0.2–1.2)
BUN: 11 mg/dL (ref 7–25)
CO2: 24 mmol/L (ref 20–31)
Calcium: 9.6 mg/dL (ref 8.6–10.3)
Chloride: 105 mmol/L (ref 98–110)
Creat: 1.38 mg/dL — ABNORMAL HIGH (ref 0.60–1.35)
Glucose, Bld: 97 mg/dL (ref 65–99)
POTASSIUM: 4.4 mmol/L (ref 3.5–5.3)
Sodium: 141 mmol/L (ref 135–146)
TOTAL PROTEIN: 7.2 g/dL (ref 6.1–8.1)

## 2016-03-21 LAB — CBC
HCT: 44.1 % (ref 38.5–50.0)
HEMOGLOBIN: 15.2 g/dL (ref 13.2–17.1)
MCH: 30.6 pg (ref 27.0–33.0)
MCHC: 34.5 g/dL (ref 32.0–36.0)
MCV: 88.9 fL (ref 80.0–100.0)
MPV: 8.3 fL (ref 7.5–12.5)
PLATELETS: 280 10*3/uL (ref 140–400)
RBC: 4.96 MIL/uL (ref 4.20–5.80)
RDW: 13.5 % (ref 11.0–15.0)
WBC: 4.7 10*3/uL (ref 3.8–10.8)

## 2016-03-21 LAB — LIPID PANEL
CHOLESTEROL: 134 mg/dL (ref <200)
HDL: 45 mg/dL (ref >40)
LDL Cholesterol: 62 mg/dL (ref <100)
Total CHOL/HDL Ratio: 3 Ratio (ref <5.0)
Triglycerides: 133 mg/dL (ref <150)
VLDL: 27 mg/dL (ref <30)

## 2016-03-21 NOTE — Assessment & Plan Note (Signed)
will repeat his labs today.  If Cr still up, will send him to renal, check u/s.  Offered/refused condoms.  O/w doing very well.  Has gotten flu shot.  rtc in 6 months.

## 2016-03-21 NOTE — Progress Notes (Signed)
   Subjective:    Patient ID: Darrell [name], male    DOB: Dec 24, 1990, 25 y.o.   MRN: [id number]  HPI 26 yo M with HIV dx July 2015. He has been maintained on triumeq since.  Also with worsening Cr, normal EGfr.  No problems with ART.  Has been feeling well.  Has gotten flu shot.  Has been working out [employer].   HIV 1 RNA Quant (copies/mL)  Date Value  12/30/2015 <20  06/07/2015 <20  11/03/2014 <20   CD4 T Cell Abs (/uL)  Date Value  12/30/2015 630  06/07/2015 640  09/01/2014 830    Review of Systems  Constitutional: Negative for appetite change and unexpected weight change.  Gastrointestinal: Negative for constipation and diarrhea.  Genitourinary: Negative for difficulty urinating.       Objective:   Physical Exam  Constitutional: He appears well-developed and well-nourished.  HENT:  Mouth/Throat: No oropharyngeal exudate.  Eyes: EOM are normal. Pupils are equal, round, and reactive to light.  Neck: Neck supple.  Cardiovascular: Normal rate, regular rhythm and normal heart sounds.   Pulmonary/Chest: Effort normal and breath sounds normal.  Abdominal: Soft. Bowel sounds are normal. There is no tenderness. There is no rebound.  Musculoskeletal: He exhibits no edema.  Lymphadenopathy:    He has no cervical adenopathy.      Assessment & Plan:

## 2016-03-21 NOTE — Addendum Note (Signed)
Addended by: Mariea ClontsGREEN, Keonta Monceaux D on: 03/21/2016 05:52 PM   Modules accepted: Orders

## 2016-03-21 NOTE — Assessment & Plan Note (Signed)
States he has been drinking lots of water.  Has not been taking NSAIDS

## 2016-03-22 LAB — RPR

## 2016-03-22 LAB — T-HELPER CELL (CD4) - (RCID CLINIC ONLY)
CD4 % Helper T Cell: 34 % (ref 33–55)
CD4 T CELL ABS: 700 /uL (ref 400–2700)

## 2016-03-23 LAB — URINE CYTOLOGY ANCILLARY ONLY
CHLAMYDIA, DNA PROBE: NEGATIVE
NEISSERIA GONORRHEA: NEGATIVE

## 2016-03-23 LAB — HIV-1 RNA QUANT-NO REFLEX-BLD
HIV 1 RNA Quant: <20 copies/mL
HIV-1 RNA Quant, Log: <1.3 Log copies/mL

## 2016-04-07 DIAGNOSIS — K602 Anal fissure, unspecified: Secondary | ICD-10-CM | POA: Diagnosis not present

## 2016-04-26 MED FILL — TRIUMEQ 600-50-300 MG TABS: 600-50-300 | 30 days supply | Qty: 30 | Fill #0

## 2016-06-21 DIAGNOSIS — Z800 Family history of malignant neoplasm of digestive organs: Secondary | ICD-10-CM | POA: Diagnosis not present

## 2016-06-21 DIAGNOSIS — K625 Hemorrhage of anus and rectum: Secondary | ICD-10-CM | POA: Diagnosis not present

## 2016-06-21 DIAGNOSIS — L98491 Non-pressure chronic ulcer of skin of other sites limited to breakdown of skin: Secondary | ICD-10-CM | POA: Diagnosis not present

## 2016-06-21 MED FILL — SUPREP BOWEL PREP KIT: 17.5-3.13-1 | 1 days supply | Qty: 354 | Fill #0

## 2016-06-26 ENCOUNTER — Other Ambulatory Visit: Payer: 59

## 2016-06-26 MED FILL — TRIUMEQ 600-50-300 MG TABS: 600-50-300 | 30 days supply | Qty: 30 | Fill #1

## 2016-07-03 ENCOUNTER — Other Ambulatory Visit: Payer: 59

## 2016-07-03 ENCOUNTER — Other Ambulatory Visit (HOSPITAL_COMMUNITY)
Admission: RE | Admit: 2016-07-03 | Discharge: 2016-07-03 | Disposition: A | Discharge status: Home / Self Care | Payer: 59 | Source: Ambulatory Visit | Attending: Infectious Diseases | Admitting: Infectious Diseases

## 2016-07-03 DIAGNOSIS — Z113 Encounter for screening for infections with a predominantly sexual mode of transmission: Secondary | ICD-10-CM | POA: Insufficient documentation

## 2016-07-03 DIAGNOSIS — Z79899 Other long term (current) drug therapy: Secondary | ICD-10-CM | POA: Diagnosis not present

## 2016-07-03 DIAGNOSIS — B20 Human immunodeficiency virus [HIV] disease: Secondary | ICD-10-CM

## 2016-07-03 LAB — CBC
HEMATOCRIT: 43.8 % (ref 38.5–50.0)
Hemoglobin: 15.1 g/dL (ref 13.2–17.1)
MCH: 30.6 pg (ref 27.0–33.0)
MCHC: 34.5 g/dL (ref 32.0–36.0)
MCV: 88.7 fL (ref 80.0–100.0)
MPV: 8.5 fL (ref 7.5–12.5)
Platelets: 246 10*3/uL (ref 140–400)
RBC: 4.94 MIL/uL (ref 4.20–5.80)
RDW: 13.7 % (ref 11.0–15.0)
WBC: 4.5 10*3/uL (ref 3.8–10.8)

## 2016-07-04 LAB — COMPREHENSIVE METABOLIC PANEL
ALBUMIN: 4 g/dL (ref 3.6–5.1)
ALT: 21 U/L (ref 9–46)
AST: 24 U/L (ref 10–40)
Alkaline Phosphatase: 40 U/L (ref 40–115)
BILIRUBIN TOTAL: 0.8 mg/dL (ref 0.2–1.2)
BUN: 11 mg/dL (ref 7–25)
CALCIUM: 9.3 mg/dL (ref 8.6–10.3)
CHLORIDE: 105 mmol/L (ref 98–110)
CO2: 21 mmol/L (ref 20–31)
Creat: 1.52 mg/dL — ABNORMAL HIGH (ref 0.60–1.35)
GLUCOSE: 91 mg/dL (ref 65–99)
POTASSIUM: 4.2 mmol/L (ref 3.5–5.3)
Sodium: 140 mmol/L (ref 135–146)
Total Protein: 6.6 g/dL (ref 6.1–8.1)

## 2016-07-04 LAB — T-HELPER CELL (CD4) - (RCID CLINIC ONLY)
CD4 % Helper T Cell: 32 % — ABNORMAL LOW (ref 33–55)
CD4 T CELL ABS: 740 /uL (ref 400–2700)

## 2016-07-04 LAB — LIPID PANEL
CHOL/HDL RATIO: 3.1 ratio (ref <5.0)
Cholesterol: 127 mg/dL (ref <200)
HDL: 41 mg/dL (ref >40)
LDL CALC: 67 mg/dL (ref <100)
TRIGLYCERIDES: 96 mg/dL (ref <150)
VLDL: 19 mg/dL (ref <30)

## 2016-07-04 LAB — URINE CYTOLOGY ANCILLARY ONLY
CHLAMYDIA, DNA PROBE: NEGATIVE
NEISSERIA GONORRHEA: NEGATIVE

## 2016-07-04 LAB — RPR

## 2016-07-05 DIAGNOSIS — K648 Other hemorrhoids: Secondary | ICD-10-CM | POA: Diagnosis not present

## 2016-07-05 DIAGNOSIS — K921 Melena: Secondary | ICD-10-CM | POA: Diagnosis not present

## 2016-07-05 LAB — HIV-1 RNA QUANT-NO REFLEX-BLD
HIV 1 RNA Quant: <20 copies/mL
HIV-1 RNA Quant, Log: <1.3 Log copies/mL

## 2016-07-10 ENCOUNTER — Ambulatory Visit (INDEPENDENT_AMBULATORY_CARE_PROVIDER_SITE_OTHER): Payer: 59 | Admitting: Infectious Disease

## 2016-07-10 ENCOUNTER — Encounter: Payer: Self-pay | Admitting: Infectious Disease

## 2016-07-10 VITALS — BP 133/83 | HR 80 | Wt 196.0 lb

## 2016-07-10 DIAGNOSIS — Z113 Encounter for screening for infections with a predominantly sexual mode of transmission: Secondary | ICD-10-CM

## 2016-07-10 DIAGNOSIS — N179 Acute kidney failure, unspecified: Secondary | ICD-10-CM

## 2016-07-10 DIAGNOSIS — F17200 Nicotine dependence, unspecified, uncomplicated: Secondary | ICD-10-CM | POA: Diagnosis not present

## 2016-07-10 DIAGNOSIS — B20 Human immunodeficiency virus [HIV] disease: Secondary | ICD-10-CM | POA: Diagnosis not present

## 2016-07-10 HISTORY — DX: Acute kidney failure, unspecified: N17.9

## 2016-07-10 NOTE — Progress Notes (Signed)
Chief complaint: Follow up for HIV on medications  Subjective:    Patient ID: Darrell [name], male    DOB: [birth year], 26 y.o.   MRN: [id number]  HPI  26 year old [place] American man with HIV, who tested positive in July 2015 after negative test in June of 2015. He has HIV + partner. His VL at intake was in the 300s and CD4 above 400.  No resistance data back. HLA b701 negative.Since then he has fully suppress his virus on TRIUMEQ and maintains a healthy CD4 count. He did change jobs and lost insurance temporarily and went without medications for roughly 1 month. He is back on his [place]   He continues to maintain perfect virological suppression. We have discussed having him see me just once yearly which I'm okay with. I did notice an increase in his serum creatinine on last lab value dropped 1.5 range. He has increased his weight and also his muscle weight with increased weight lifting. Will recheck a metabolic panel today with liver function tests CPK microalbumin valve and creatinine ratio urine sodium and urine creatinine. He does a primary care doctor as well [facility]   Past Medical History:  Diagnosis Date  . HIV infection ([place])   . Screen for STD (sexually transmitted disease) 09/09/2014  . Smoker 11/23/2014    No past surgical history on file.  Family History  Problem Relation Age of Onset  . Cancer Mother   . Fibromyalgia Mother   . Hypertension Mother       Social History   Social History  . Marital status: Single    Spouse name: N/A  . Number of children: N/A  . Years of education: N/A   Social History Main Topics  . Smoking status: Former Smoker    Quit date: 06/21/2015  . Smokeless tobacco: Never Used     Comment: 4 x weekly  . Alcohol use 0.0 oz/week     Comment: recently stopped per patient  . Drug use: Yes    Frequency: 7.0 times per week    Types: Marijuana     Comment: daily  . Sexual activity: Yes    Partners: Male     Comment: pt declined  condoms   Other Topics Concern  . None   Social History Narrative  . None      Review of Systems  Constitutional: Negative for activity change, appetite change, chills, diaphoresis, fatigue, fever and unexpected weight change.  HENT: Negative for congestion, rhinorrhea, sinus pressure, sneezing, sore throat and trouble swallowing.   Eyes: Negative for photophobia and visual disturbance.  Respiratory: Negative for cough, chest tightness, shortness of breath, wheezing and stridor.   Cardiovascular: Negative for chest pain, palpitations and leg swelling.  Gastrointestinal: Negative for abdominal distention, abdominal pain, anal bleeding, blood in stool, constipation, diarrhea, nausea and vomiting.  Genitourinary: Negative for difficulty urinating, dysuria, flank pain and hematuria.  Musculoskeletal: Negative for arthralgias, back pain, gait problem and myalgias.  Skin: Negative for color change, pallor, rash and wound.  Neurological: Negative for dizziness, tremors, weakness and light-headedness.  Hematological: Negative for adenopathy. Does not bruise/bleed easily.  Psychiatric/Behavioral: Negative for agitation, behavioral problems, confusion, decreased concentration, dysphoric mood and sleep disturbance.           Objective:   Physical Exam  Constitutional: He is oriented to person, place, and time. He appears well-developed and well-nourished.  HENT:  Head: Normocephalic and atraumatic.  Eyes: Conjunctivae and EOM are normal.  Neck:  Normal range of motion. Neck supple.  Cardiovascular: Normal rate and regular rhythm.   Pulmonary/Chest: Effort normal. No respiratory distress. He has no wheezes.  Abdominal: Soft. He exhibits no distension.  Musculoskeletal: Normal range of motion. He exhibits no edema or tenderness.  Neurological: He is alert and oriented to person, place, and time.  Skin: Skin is warm and dry. No rash noted. No erythema. No pallor.  Psychiatric: He has a  normal mood and affect. His behavior is normal. Judgment and thought content normal.        Assessment & Plan:   HIV: Perfect control   Pre-HTN: BP up at times. Primary care can manage this I think if his [place] valve and creatinine ratio is increased and he is showing some microalbuminuria I would start him on an ACE inhibitor  Acute on chronic renal insufficiency: Some of this may be due to increase in his serum creatinine the setting of increased muscle mass while weight lifting.   I spent greater than 25 minutes with the patient including greater than 50% of time in face to face counsel of the patient guarding his HIV is pre-hypertension his acute on chronic renal insufficiency and in coordination of his care.   Marland Kitchen

## 2016-07-11 ENCOUNTER — Telehealth: Payer: Self-pay | Admitting: *Deleted

## 2016-07-11 LAB — SODIUM, URINE, RANDOM: Sodium, Ur: 202 mmol/L (ref 28–272)

## 2016-07-11 LAB — COMPLETE METABOLIC PANEL WITH GFR
ALT: 28 U/L (ref 9–46)
AST: 24 U/L (ref 10–40)
Albumin: 4.3 g/dL (ref 3.6–5.1)
Alkaline Phosphatase: 51 U/L (ref 40–115)
BUN: 13 mg/dL (ref 7–25)
CHLORIDE: 105 mmol/L (ref 98–110)
CO2: 27 mmol/L (ref 20–31)
Calcium: 9.4 mg/dL (ref 8.6–10.3)
Creat: 1.25 mg/dL (ref 0.60–1.35)
GFR, EST NON AFRICAN AMERICAN: 78 mL/min (ref >=60)
GLUCOSE: 86 mg/dL (ref 65–99)
POTASSIUM: 4.3 mmol/L (ref 3.5–5.3)
SODIUM: 139 mmol/L (ref 135–146)
TOTAL PROTEIN: 6.8 g/dL (ref 6.1–8.1)
Total Bilirubin: 0.7 mg/dL (ref 0.2–1.2)

## 2016-07-11 LAB — MICROALBUMIN / CREATININE URINE RATIO
CREATININE, URINE: 232 mg/dL (ref 20–370)
MICROALB UR: 0.3 mg/dL
MICROALB/CREAT RATIO: 1 ug/mg{creat} (ref <30)

## 2016-07-11 LAB — CK: CK TOTAL: 171 U/L (ref 44–196)

## 2016-07-11 NOTE — Telephone Encounter (Signed)
Kidney fxn much improved with cr to 1.25 now. Essentially normal

## 2016-07-11 NOTE — Telephone Encounter (Signed)
Patient called for intepretation of his lab results, is concerned about his kidneys and the next step/medication that was discussed at his office visit. Patient also updated his PCP information, would like last note/labs sent there for continuity of care. Please advise on medication/lab results. Andree CossHowell, Michelle M, RN

## 2016-08-25 MED FILL — TRIUMEQ 600-50-300 MG TABS: 600-50-300 | 30 days supply | Qty: 30 | Fill #2

## 2016-10-09 MED FILL — TRIUMEQ 600-50-300 MG TABS: 600-50-300 | 30 days supply | Qty: 30 | Fill #3

## 2016-10-13 MED FILL — IBUPROFEN 800 MG TAB: 800 | 8 days supply | Qty: 25 | Fill #0

## 2016-12-05 MED FILL — TRIUMEQ 600-50-300 MG TABS: 600-50-300 | 30 days supply | Qty: 30 | Fill #4

## 2017-02-22 ENCOUNTER — Other Ambulatory Visit: Payer: Self-pay | Admitting: Infectious Disease

## 2017-02-22 DIAGNOSIS — B20 Human immunodeficiency virus [HIV] disease: Secondary | ICD-10-CM

## 2017-02-22 MED FILL — TRIUMEQ 600-50-300 MG TABS: 600-50-300 | 30 days supply | Qty: 30 | Fill #0

## 2017-04-03 DIAGNOSIS — K629 Disease of anus and rectum, unspecified: Secondary | ICD-10-CM | POA: Diagnosis not present

## 2017-04-13 ENCOUNTER — Other Ambulatory Visit: Payer: Self-pay | Admitting: Infectious Disease

## 2017-04-13 DIAGNOSIS — B20 Human immunodeficiency virus [HIV] disease: Secondary | ICD-10-CM

## 2017-04-13 MED FILL — TRIUMEQ 600-50-300 MG TABS: 600-50-300 | 30 days supply | Qty: 30 | Fill #0

## 2017-06-11 MED FILL — TRIUMEQ 600-50-300 MG TABS: 600-50-300 | 30 days supply | Qty: 30 | Fill #1

## 2017-06-14 DIAGNOSIS — K602 Anal fissure, unspecified: Secondary | ICD-10-CM | POA: Diagnosis not present

## 2017-06-14 DIAGNOSIS — K6289 Other specified diseases of anus and rectum: Secondary | ICD-10-CM | POA: Diagnosis not present

## 2017-06-26 DIAGNOSIS — Z0000 Encounter for general adult medical examination without abnormal findings: Secondary | ICD-10-CM | POA: Diagnosis not present

## 2017-06-26 DIAGNOSIS — Z23 Encounter for immunization: Secondary | ICD-10-CM | POA: Diagnosis not present

## 2017-06-27 ENCOUNTER — Other Ambulatory Visit (HOSPITAL_COMMUNITY)
Admission: RE | Admit: 2017-06-27 | Discharge: 2017-06-27 | Disposition: A | Discharge status: Home / Self Care | Payer: 59 | Source: Ambulatory Visit | Attending: Infectious Disease | Admitting: Infectious Disease

## 2017-06-27 ENCOUNTER — Other Ambulatory Visit: Payer: 59

## 2017-06-27 DIAGNOSIS — N179 Acute kidney failure, unspecified: Secondary | ICD-10-CM | POA: Diagnosis not present

## 2017-06-27 NOTE — Addendum Note (Signed)
Addended by: ABBITT, KATRINA F on: 06/27/2017 08:13 AM ° ° Modules accepted: Orders ° °

## 2017-06-27 NOTE — Addendum Note (Signed)
Addended byJimmy Picket F on: 06/27/2017 08:13 AM   Modules accepted: Orders

## 2017-06-27 NOTE — Addendum Note (Signed)
Addended by: Dreama Saa on: 06/27/2017 09:06 AM   Modules accepted: Orders

## 2017-06-27 NOTE — Addendum Note (Signed)
Addended byJimmy Picket F on: 06/27/2017 08:12 AM   Modules accepted: Orders

## 2017-06-28 LAB — CBC WITH DIFFERENTIAL/PLATELET
BASOS ABS: 29 {cells}/uL (ref 0–200)
Basophils Relative: 0.7 %
EOS ABS: 119 {cells}/uL (ref 15–500)
Eosinophils Relative: 2.9 %
HCT: 41.7 % (ref 38.5–50.0)
Hemoglobin: 14.6 g/dL (ref 13.2–17.1)
Lymphs Abs: 2276 cells/uL (ref 850–3900)
MCH: 30.4 pg (ref 27.0–33.0)
MCHC: 35 g/dL (ref 32.0–36.0)
MCV: 86.7 fL (ref 80.0–100.0)
MONOS PCT: 10.5 %
MPV: 9 fL (ref 7.5–12.5)
NEUTROS PCT: 30.4 %
Neutro Abs: 1246 cells/uL — ABNORMAL LOW (ref 1500–7800)
PLATELETS: 256 10*3/uL (ref 140–400)
RBC: 4.81 10*6/uL (ref 4.20–5.80)
RDW: 13 % (ref 11.0–15.0)
TOTAL LYMPHOCYTE: 55.5 %
WBC: 4.1 10*3/uL (ref 3.8–10.8)
WBCMIX: 431 {cells}/uL (ref 200–950)

## 2017-06-28 LAB — COMPLETE METABOLIC PANEL WITH GFR
AG Ratio: 1.4 (calc) (ref 1.0–2.5)
ALBUMIN MSPROF: 4.2 g/dL (ref 3.6–5.1)
ALT: 13 U/L (ref 9–46)
AST: 18 U/L (ref 10–40)
Alkaline phosphatase (APISO): 54 U/L (ref 40–115)
BILIRUBIN TOTAL: 0.8 mg/dL (ref 0.2–1.2)
BUN/Creatinine Ratio: 9 (calc) (ref 6–22)
BUN: 13 mg/dL (ref 7–25)
CALCIUM: 9.5 mg/dL (ref 8.6–10.3)
CHLORIDE: 104 mmol/L (ref 98–110)
CO2: 29 mmol/L (ref 20–32)
Creat: 1.4 mg/dL — ABNORMAL HIGH (ref 0.60–1.35)
GFR, EST NON AFRICAN AMERICAN: 68 mL/min/{1.73_m2} (ref >=60)
GFR, Est African American: 79 mL/min/{1.73_m2} (ref >=60)
GLUCOSE: 103 mg/dL — AB (ref 65–99)
Globulin: 2.9 g/dL (calc) (ref 1.9–3.7)
POTASSIUM: 4.2 mmol/L (ref 3.5–5.3)
SODIUM: 139 mmol/L (ref 135–146)
TOTAL PROTEIN: 7.1 g/dL (ref 6.1–8.1)

## 2017-06-28 LAB — URINE CYTOLOGY ANCILLARY ONLY
CHLAMYDIA, DNA PROBE: NEGATIVE
Neisseria Gonorrhea: NEGATIVE

## 2017-06-28 LAB — LIPID PANEL
CHOL/HDL RATIO: 3.2 (calc) (ref <5.0)
CHOLESTEROL: 152 mg/dL (ref <200)
HDL: 48 mg/dL (ref >40)
LDL CHOLESTEROL (CALC): 87 mg/dL
Non-HDL Cholesterol (Calc): 104 mg/dL (calc) (ref <130)
Triglycerides: 82 mg/dL (ref <150)

## 2017-06-28 LAB — RPR: RPR Ser Ql: NONREACTIVE

## 2017-06-28 LAB — T-HELPER CELL (CD4) - (RCID CLINIC ONLY)
CD4 % Helper T Cell: 34 % (ref 33–55)
CD4 T Cell Abs: 740 /uL (ref 400–2700)

## 2017-06-29 LAB — HIV-1 RNA QUANT-NO REFLEX-BLD
HIV 1 RNA Quant: <20 copies/mL
HIV-1 RNA Quant, Log: <1.3 Log copies/mL

## 2017-07-03 DIAGNOSIS — R9389 Abnormal findings on diagnostic imaging of other specified body structures: Secondary | ICD-10-CM | POA: Diagnosis not present

## 2017-07-05 DIAGNOSIS — K648 Other hemorrhoids: Secondary | ICD-10-CM | POA: Diagnosis not present

## 2017-07-05 DIAGNOSIS — K5120 Ulcerative (chronic) proctitis without complications: Secondary | ICD-10-CM | POA: Diagnosis not present

## 2017-07-05 DIAGNOSIS — R935 Abnormal findings on diagnostic imaging of other abdominal regions, including retroperitoneum: Secondary | ICD-10-CM | POA: Diagnosis not present

## 2017-07-05 DIAGNOSIS — K6289 Other specified diseases of anus and rectum: Secondary | ICD-10-CM | POA: Diagnosis not present

## 2017-07-11 ENCOUNTER — Ambulatory Visit: Payer: 59 | Admitting: Infectious Disease

## 2017-07-16 IMAGING — CR DG CHEST 2V
2 series · 2 of 2 positions shown · non-contrast
Comparison: None.

CLINICAL DATA: Pt complains of left sided chest pain/tightness
that's causing tingling down left arm also with some SOB since last
night. Some light headedness also. Occasional smoker. SHielded pt.

EXAM:
CHEST  2 VIEW

[w chest pa]
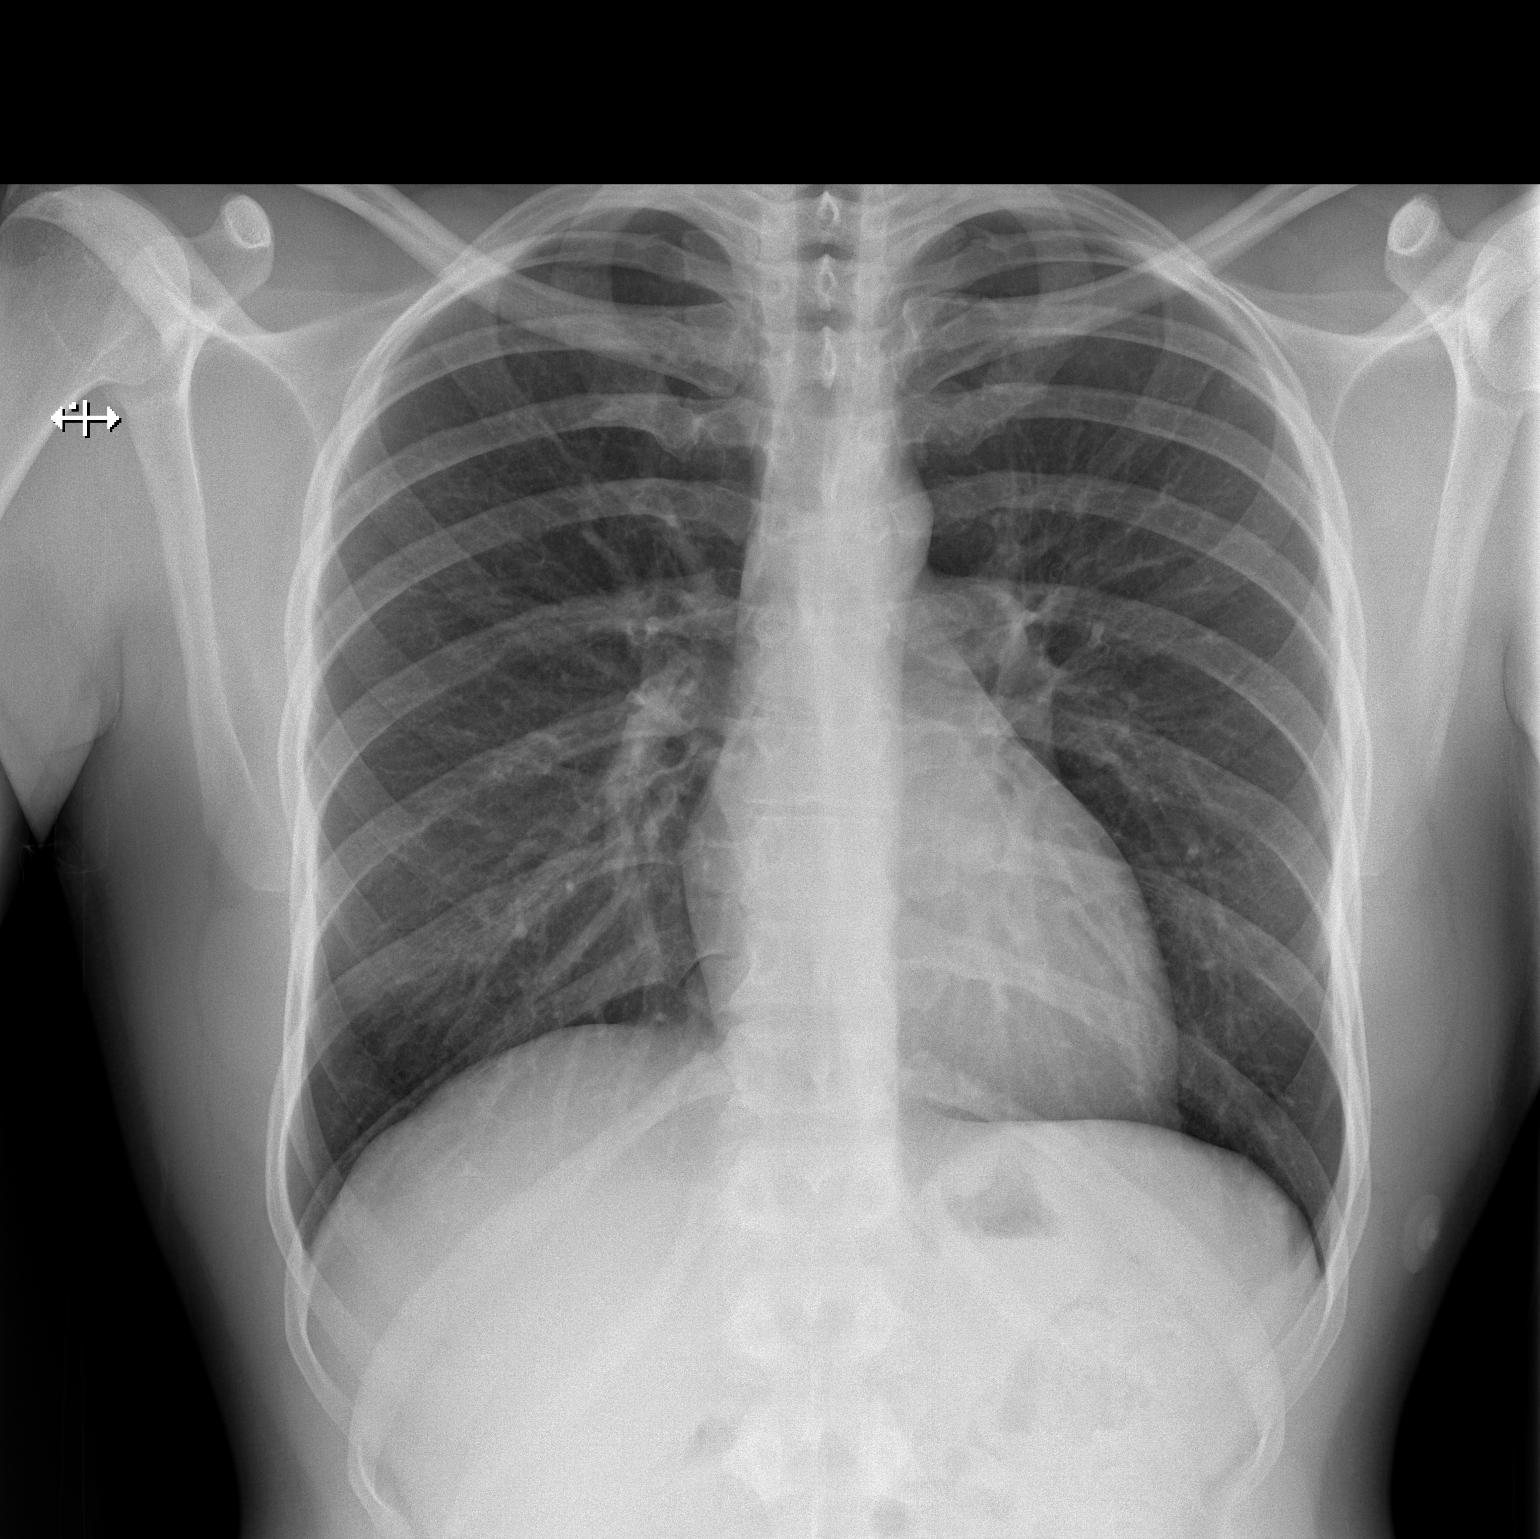

[w chest lat]
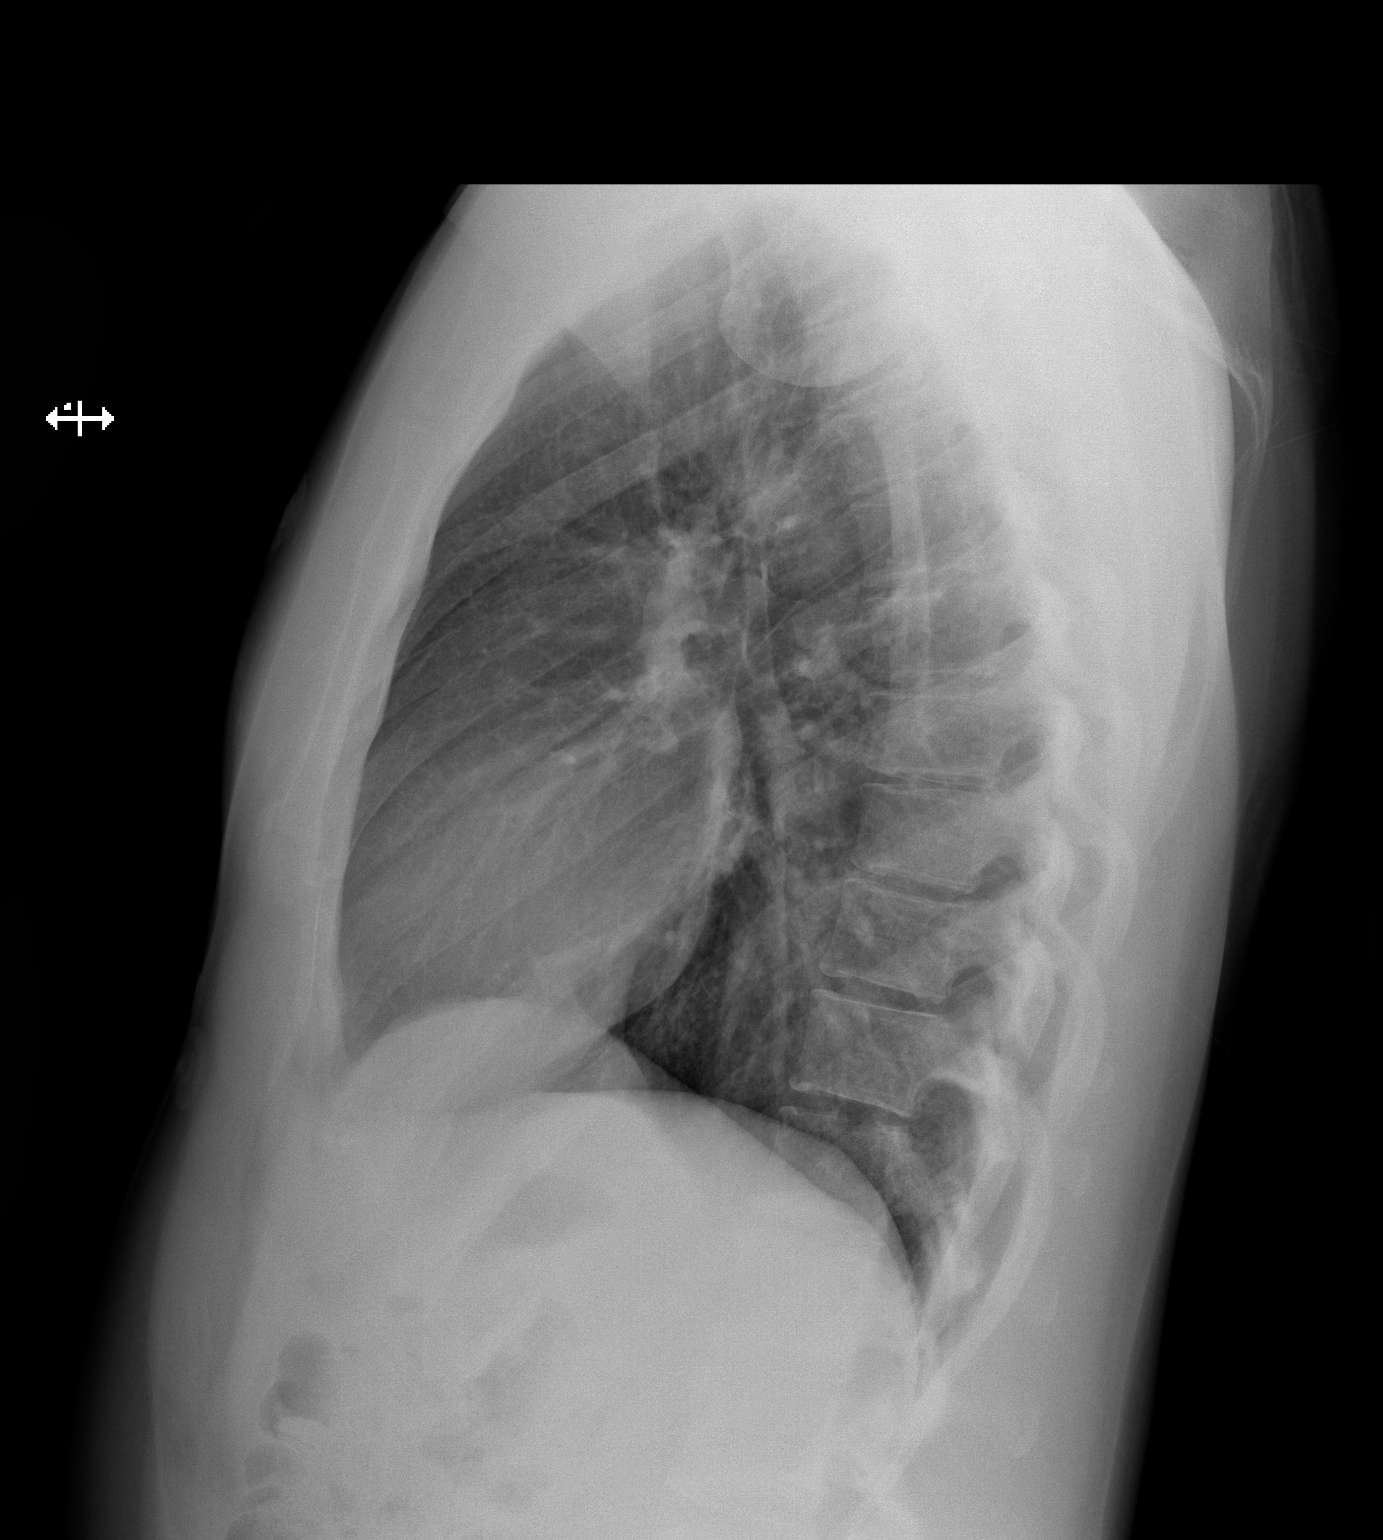

[2 of 2 positions shown; findings below may reference images not displayed]

FINDINGS: Midline trachea.  Normal heart size and mediastinal contours.

Sharp costophrenic angles.  No pneumothorax.  Clear lungs.
IMPRESSION: No active cardiopulmonary disease.

## 2017-07-26 ENCOUNTER — Ambulatory Visit: Payer: 59 | Admitting: Infectious Disease

## 2017-08-01 MED FILL — TRIUMEQ 600-50-300 MG TABS: 600-50-300 | 30 days supply | Qty: 30 | Fill #2

## 2017-08-28 ENCOUNTER — Ambulatory Visit: Payer: 59 | Admitting: Infectious Disease

## 2017-09-04 ENCOUNTER — Ambulatory Visit (INDEPENDENT_AMBULATORY_CARE_PROVIDER_SITE_OTHER): Payer: 59 | Admitting: Infectious Disease

## 2017-09-04 ENCOUNTER — Encounter: Payer: Self-pay | Admitting: Infectious Disease

## 2017-09-04 VITALS — BP 137/84 | HR 60 | Temp 98.2°F | Ht 71.0 in | Wt 194.0 lb

## 2017-09-04 DIAGNOSIS — Z113 Encounter for screening for infections with a predominantly sexual mode of transmission: Secondary | ICD-10-CM

## 2017-09-04 DIAGNOSIS — Z79899 Other long term (current) drug therapy: Secondary | ICD-10-CM

## 2017-09-04 DIAGNOSIS — Z23 Encounter for immunization: Secondary | ICD-10-CM

## 2017-09-04 DIAGNOSIS — B20 Human immunodeficiency virus [HIV] disease: Secondary | ICD-10-CM

## 2017-09-04 NOTE — Progress Notes (Signed)
Chief complaint: Follow up for HIV on medications  Subjective:    Patient ID: Darrell [name], male    DOB: 1990/05/29, 27 y.o.   MRN: [id number]  HPI  27 year old [place] American man with HIV, who tested positive in July 2015 after negative test in June of 2015. He has HIV + partner. His VL at intake was in the 300s and CD4 above 400.  No resistance data back. HLA b701 negative.Since then he has fully suppress his virus on TRIUMEQ and maintains a healthy CD4 count. He did change jobs and lost insurance temporarily and went without medications for roughly 1 month. He is back on his [place]   He continues to maintain perfect virological suppression.  He has no complaints today.   Past Medical History:  Diagnosis Date  . AKI (acute kidney injury) ([place]) 07/10/2016  . HIV infection ([place])   . Screen for STD (sexually transmitted disease) 09/09/2014  . Smoker 11/23/2014    No past surgical history on file.  Family History  Problem Relation Age of Onset  . Cancer Mother   . Fibromyalgia Mother   . Hypertension Mother       Social History   Socioeconomic History  . Marital status: Single    Spouse name: Not on file  . Number of children: Not on file  . Years of education: Not on file  . Highest education level: Not on file  Occupational History  . Not on file  Social Needs  . Financial resource strain: Not on file  . Food insecurity:    Worry: Not on file    Inability: Not on file  . Transportation needs:    Medical: Not on file    Non-medical: Not on file  Tobacco Use  . Smoking status: Former Smoker    Last attempt to quit: 06/21/2015    Years since quitting: 2.2  . Smokeless tobacco: Never Used  . Tobacco comment: 4 x weekly  Substance and Sexual Activity  . Alcohol use: Yes    Alcohol/week: 0.0 oz    Comment: recently stopped per patient  . Drug use: Yes    Frequency: 7.0 times per week    Types: Marijuana    Comment: daily  . Sexual activity: Yes   Partners: Male    Birth control/protection: Condom    Comment: pt declined condoms  Lifestyle  . Physical activity:    Days per week: Not on file    Minutes per session: Not on file  . Stress: Not on file  Relationships  . Social connections:    Talks on phone: Not on file    Gets together: Not on file    Attends religious service: Not on file    Active member of club or organization: Not on file    Attends meetings of clubs or organizations: Not on file    Relationship status: Not on file  Other Topics Concern  . Not on file  Social History Narrative  . Not on file      Review of Systems  Constitutional: Negative for activity change, appetite change, chills, diaphoresis, fatigue, fever and unexpected weight change.  HENT: Negative for congestion, rhinorrhea, sinus pressure, sneezing, sore throat and trouble swallowing.   Eyes: Negative for photophobia and visual disturbance.  Respiratory: Negative for cough, chest tightness, shortness of breath, wheezing and stridor.   Cardiovascular: Negative for chest pain, palpitations and leg swelling.  Gastrointestinal: Negative for abdominal distention, abdominal pain,  anal bleeding, blood in stool, constipation, diarrhea, nausea and vomiting.  Genitourinary: Negative for difficulty urinating, dysuria, flank pain and hematuria.  Musculoskeletal: Negative for arthralgias, back pain, gait problem and myalgias.  Skin: Negative for color change, pallor, rash and wound.  Neurological: Negative for dizziness, tremors, weakness and light-headedness.  Hematological: Negative for adenopathy. Does not bruise/bleed easily.  Psychiatric/Behavioral: Negative for agitation, behavioral problems, confusion, decreased concentration, dysphoric mood and sleep disturbance.           Objective:   Physical Exam  Constitutional: He is oriented to person, place, and time. He appears well-developed and well-nourished.  HENT:  Head: Normocephalic and  atraumatic.  Eyes: Conjunctivae and EOM are normal.  Neck: Normal range of motion. Neck supple.  Cardiovascular: Normal rate and regular rhythm.  Pulmonary/Chest: Effort normal. No respiratory distress. He has no wheezes.  Abdominal: Soft. He exhibits no distension.  Musculoskeletal: Normal range of motion. He exhibits no edema or tenderness.  Neurological: He is alert and oriented to person, place, and time.  Skin: Skin is warm and dry. No rash noted. No erythema. No pallor.  Psychiatric: He has a normal mood and affect. His behavior is normal. Judgment and thought content normal.        Assessment & Plan:   HIV: Perfect control   Pre-HTN: BP up at times. Will recheck again. I have asked him to monitor his BP at home with ambulatory cuff.  Acute on chronic renal insufficiency: I think much of this is due to muscle mass    .

## 2017-09-21 ENCOUNTER — Telehealth: Payer: Self-pay

## 2017-09-21 NOTE — Telephone Encounter (Signed)
Pt called returning a call for Nadeen LandauMichelle, Rn regarding picking up medication Triumeq. Pt stated he would not be able to come into clinic today because he is at work and does not get off until 4. Will f/u with Rn on plan for pt. Lorenso CourierJose L Hays Dunnigan, [place]

## 2017-09-21 NOTE — Telephone Encounter (Signed)
Patient has new insurance coverage, but has not yet received his cards/information.  He ran out of Triumeq on 7/31. RN able to procure a bottle for him, patient will call then come to [facility] for pick up today.  He will continue to get details for his new insurance (effective 09/20/17) and will fill through them next month. Andree CossHowell, Abdullahi Vallone M, RN

## 2017-10-19 ENCOUNTER — Encounter (HOSPITAL_BASED_OUTPATIENT_CLINIC_OR_DEPARTMENT_OTHER): Payer: Self-pay | Admitting: Emergency Medicine

## 2017-10-19 ENCOUNTER — Emergency Department (HOSPITAL_BASED_OUTPATIENT_CLINIC_OR_DEPARTMENT_OTHER)
Admission: EM | Admit: 2017-10-19 | Discharge: 2017-10-20 | Disposition: A | Discharge status: Home / Self Care | Payer: No Typology Code available for payment source | Attending: Emergency Medicine | Admitting: Emergency Medicine

## 2017-10-19 ENCOUNTER — Other Ambulatory Visit: Payer: Self-pay

## 2017-10-19 DIAGNOSIS — S199XXA Unspecified injury of neck, initial encounter: Secondary | ICD-10-CM | POA: Diagnosis present

## 2017-10-19 DIAGNOSIS — Y92410 Unspecified street and highway as the place of occurrence of the external cause: Secondary | ICD-10-CM | POA: Diagnosis not present

## 2017-10-19 DIAGNOSIS — Z87891 Personal history of nicotine dependence: Secondary | ICD-10-CM | POA: Insufficient documentation

## 2017-10-19 DIAGNOSIS — Y939 Activity, unspecified: Secondary | ICD-10-CM | POA: Diagnosis not present

## 2017-10-19 DIAGNOSIS — S161XXA Strain of muscle, fascia and tendon at neck level, initial encounter: Secondary | ICD-10-CM

## 2017-10-19 DIAGNOSIS — Y999 Unspecified external cause status: Secondary | ICD-10-CM | POA: Diagnosis not present

## 2017-10-19 DIAGNOSIS — Z21 Asymptomatic human immunodeficiency virus [HIV] infection status: Secondary | ICD-10-CM | POA: Insufficient documentation

## 2017-10-19 DIAGNOSIS — Z79899 Other long term (current) drug therapy: Secondary | ICD-10-CM | POA: Diagnosis not present

## 2017-10-19 DIAGNOSIS — S29012A Strain of muscle and tendon of back wall of thorax, initial encounter: Secondary | ICD-10-CM | POA: Diagnosis not present

## 2017-10-19 MED ORDER — CYCLOBENZAPRINE HCL 5 MG PO TABS
5.0000 mg | ORAL_TABLET | Freq: Once | ORAL | Status: AC
  Administered 2017-10-19: 5 mg via ORAL
  Filled 2017-10-19: qty 1

## 2017-10-19 MED ORDER — KETOROLAC TROMETHAMINE 60 MG/2ML IM SOLN
30.0000 mg | Freq: Once | INTRAMUSCULAR | Status: AC
  Administered 2017-10-19: 30 mg via INTRAMUSCULAR
  Filled 2017-10-19: qty 2
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
## 2017-10-19 NOTE — ED Notes (Signed)
Pt placed in C-collar

## 2017-10-20 MED ORDER — IBUPROFEN 600 MG PO TABS: 600.0000 mg | ORAL_TABLET | Freq: Four times a day (QID) | ORAL | 0 refills | Status: DC | PRN

## 2017-10-20 MED ORDER — CYCLOBENZAPRINE HCL 10 MG PO TABS: 10.0000 mg | ORAL_TABLET | Freq: Every day | ORAL | 0 refills | Status: AC
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
## 2017-10-20 NOTE — Discharge Instructions (Signed)
You may use over-the-counter Motrin (Ibuprofen), Acetaminophen (Tylenol), topical muscle creams such as SalonPas, Icy Hot, Bengay, etc. Please stretch, apply heat, and have massage therapy for additional assistance. ° °

## 2017-10-31 ENCOUNTER — Telehealth: Payer: Self-pay | Admitting: *Deleted

## 2017-10-31 DIAGNOSIS — B20 Human immunodeficiency virus [HIV] disease: Secondary | ICD-10-CM

## 2017-10-31 MED ORDER — ABACAVIR-DOLUTEGRAVIR-LAMIVUD 600-50-300 MG PO TABS: 1.0000 | ORAL_TABLET | Freq: Every day | ORAL | 1 refills | Status: DC

## 2017-10-31 NOTE — Telephone Encounter (Signed)
Patient has new insurance, needs to use new pharmacy ([facility]).  RN confirmed, sent prescription. Andree Coss, RN

## 2017-11-19 ENCOUNTER — Other Ambulatory Visit: Payer: Self-pay | Admitting: *Deleted

## 2017-11-19 DIAGNOSIS — B20 Human immunodeficiency virus [HIV] disease: Secondary | ICD-10-CM

## 2017-11-19 MED ORDER — ABACAVIR-DOLUTEGRAVIR-LAMIVUD 600-50-300 MG PO TABS: 1.0000 | ORAL_TABLET | Freq: Every day | ORAL | 1 refills | Status: DC

## 2018-06-20 ENCOUNTER — Other Ambulatory Visit: Payer: Self-pay | Admitting: Infectious Disease

## 2018-06-20 DIAGNOSIS — B20 Human immunodeficiency virus [HIV] disease: Secondary | ICD-10-CM

## 2018-08-19 ENCOUNTER — Telehealth: Payer: Self-pay | Admitting: *Deleted

## 2018-08-19 NOTE — Telephone Encounter (Signed)
Lets gethim into see [place] as soon as possible

## 2018-08-19 NOTE — Telephone Encounter (Signed)
7/2 for labs, financial counseling and 7/16 with you.

## 2018-08-19 NOTE — Telephone Encounter (Signed)
Received request from [facility] center for records, appointment.  Sent last note, confirmed previously scheduled appointments for labs/financial/follow up.  Patient may be released this week.   Landis Gandy, RN

## 2018-08-20 NOTE — Telephone Encounter (Signed)
I would still like him to have new meds since I doubt HMAP will activate immediately. We could use ViiV assistance or maybe there is some Triumeq here?

## 2018-08-20 NOTE — Telephone Encounter (Signed)
His ADAP is still active.

## 2018-08-20 NOTE — Telephone Encounter (Signed)
Darrell [name] I would also have him meet with pharmacy or someone who can get him meds through pharma assistance. I believe he is supposed to be on [place] and I would like him to restart on this if he is off

## 2018-08-20 NOTE — Telephone Encounter (Signed)
RN spoke with Darrell [name]. He was released yesterday, missed only 2 doses (Sunday, Monday).  He is coming Thursday 7/2 to renew ADAP on schedule, get labs.  He is following up with you on 7/16.

## 2018-08-21 NOTE — Telephone Encounter (Signed)
Oh GOOD!

## 2018-08-22 ENCOUNTER — Ambulatory Visit: Payer: Managed Care, Other (non HMO)

## 2018-08-22 ENCOUNTER — Other Ambulatory Visit (HOSPITAL_COMMUNITY)
Admission: RE | Admit: 2018-08-22 | Discharge: 2018-08-22 | Disposition: A | Discharge status: Home / Self Care | Payer: Managed Care, Other (non HMO) | Source: Ambulatory Visit | Attending: Infectious Disease | Admitting: Infectious Disease

## 2018-08-22 ENCOUNTER — Other Ambulatory Visit: Payer: Managed Care, Other (non HMO)

## 2018-08-22 ENCOUNTER — Other Ambulatory Visit: Payer: Self-pay

## 2018-08-22 DIAGNOSIS — B20 Human immunodeficiency virus [HIV] disease: Secondary | ICD-10-CM

## 2018-08-22 LAB — T-HELPER CELL (CD4) - (RCID CLINIC ONLY)
CD4 % Helper T Cell: 38 % (ref 33–65)
CD4 T Cell Abs: 839 /uL (ref 400–1790)

## 2018-08-26 LAB — URINE CYTOLOGY ANCILLARY ONLY
Chlamydia: NEGATIVE
Neisseria Gonorrhea: NEGATIVE

## 2018-08-29 LAB — COMPLETE METABOLIC PANEL WITH GFR
AG Ratio: 1.5 (calc) (ref 1.0–2.5)
ALT: 19 U/L (ref 9–46)
AST: 23 U/L (ref 10–40)
Albumin: 4.1 g/dL (ref 3.6–5.1)
Alkaline phosphatase (APISO): 44 U/L (ref 36–130)
BUN: 11 mg/dL (ref 7–25)
CO2: 29 mmol/L (ref 20–32)
Calcium: 9.4 mg/dL (ref 8.6–10.3)
Chloride: 106 mmol/L (ref 98–110)
Creat: 1.21 mg/dL (ref 0.60–1.35)
GFR, Est African American: 93 mL/min/{1.73_m2} (ref >=60)
GFR, Est Non African American: 80 mL/min/{1.73_m2} (ref >=60)
Globulin: 2.8 g/dL (calc) (ref 1.9–3.7)
Glucose, Bld: 99 mg/dL (ref 65–99)
Potassium: 3.9 mmol/L (ref 3.5–5.3)
Sodium: 141 mmol/L (ref 135–146)
Total Bilirubin: 0.7 mg/dL (ref 0.2–1.2)
Total Protein: 6.9 g/dL (ref 6.1–8.1)

## 2018-08-29 LAB — CBC WITH DIFFERENTIAL/PLATELET
Absolute Monocytes: 524 cells/uL (ref 200–950)
Basophils Absolute: 32 cells/uL (ref 0–200)
Basophils Relative: 0.8 %
Eosinophils Absolute: 172 cells/uL (ref 15–500)
Eosinophils Relative: 4.3 %
HCT: 44 % (ref 38.5–50.0)
Hemoglobin: 15.3 g/dL (ref 13.2–17.1)
Lymphs Abs: 2392 cells/uL (ref 850–3900)
MCH: 31.4 pg (ref 27.0–33.0)
MCHC: 34.8 g/dL (ref 32.0–36.0)
MCV: 90.3 fL (ref 80.0–100.0)
MPV: 8.9 fL (ref 7.5–12.5)
Monocytes Relative: 13.1 %
Neutro Abs: 880 cells/uL — ABNORMAL LOW (ref 1500–7800)
Neutrophils Relative %: 22 %
Platelets: 257 10*3/uL (ref 140–400)
RBC: 4.87 10*6/uL (ref 4.20–5.80)
RDW: 13.8 % (ref 11.0–15.0)
Total Lymphocyte: 59.8 %
WBC: 4 10*3/uL (ref 3.8–10.8)

## 2018-08-29 LAB — HIV-1 RNA QUANT-NO REFLEX-BLD
HIV 1 RNA Quant: <20 copies/mL
HIV-1 RNA Quant, Log: <1.3 Log copies/mL

## 2018-08-29 LAB — LIPID PANEL
Cholesterol: 156 mg/dL (ref <200)
HDL: 54 mg/dL (ref >=40)
LDL Cholesterol (Calc): 84 mg/dL (calc)
Non-HDL Cholesterol (Calc): 102 mg/dL (calc) (ref <130)
Total CHOL/HDL Ratio: 2.9 (calc) (ref <5.0)
Triglycerides: 86 mg/dL (ref <150)

## 2018-08-29 LAB — RPR: RPR Ser Ql: NONREACTIVE

## 2018-09-05 ENCOUNTER — Encounter: Payer: Self-pay | Admitting: Infectious Disease

## 2018-09-05 ENCOUNTER — Ambulatory Visit (INDEPENDENT_AMBULATORY_CARE_PROVIDER_SITE_OTHER): Payer: Self-pay | Admitting: Infectious Disease

## 2018-09-05 ENCOUNTER — Telehealth: Payer: Self-pay | Admitting: Pharmacy Technician

## 2018-09-05 ENCOUNTER — Other Ambulatory Visit: Payer: Self-pay

## 2018-09-05 VITALS — BP 130/90 | HR 69 | Temp 98.2°F | Wt 191.0 lb

## 2018-09-05 DIAGNOSIS — Z113 Encounter for screening for infections with a predominantly sexual mode of transmission: Secondary | ICD-10-CM

## 2018-09-05 DIAGNOSIS — I10 Essential (primary) hypertension: Secondary | ICD-10-CM

## 2018-09-05 DIAGNOSIS — B20 Human immunodeficiency virus [HIV] disease: Secondary | ICD-10-CM

## 2018-09-05 HISTORY — DX: Essential (primary) hypertension: I10

## 2018-09-05 MED ORDER — DOVATO 50-300 MG PO TABS: 1.0000 | ORAL_TABLET | Freq: Every day | ORAL | 11 refills | Status: DC

## 2018-09-05 MED FILL — DOVATO 50-300 MG TABS: 50-300 | 30 days supply | Qty: 30 | Fill #0

## 2018-09-05 NOTE — Telephone Encounter (Signed)
[facility] Patient Advocate Encounter   Was successful in obtaining a Viiv copay card for Dovato.  This copay card will make the patients copay 0.  I have spoken with the patient.    The billing information is as follows and has been shared with [place].  RxBin: [id number] PCN: loyalty Member ID: [id number] Group ID: [id number]  [place] [facility] [place] Patient [facility] for Infectious Disease Phone: [phone] Fax:  [phone]

## 2018-09-05 NOTE — Progress Notes (Signed)
Chief complaint: Follow up for HIV on medications  Subjective:    Patient ID: Darrell [name], male    DOB: 1990-03-27, 28 y.o.   MRN: [id number]  HPI  28 year old [place] American man with HIV, who tested positive in July 2015 after negative test in June of 2015. He has HIV + partner. His VL at intake was in the 300s and CD4 above 400.  No resistance data back. HLA b701 negative.Since then he has fully suppress his virus on TRIUMEQ and maintains a healthy CD4 count. He did change jobs and lost insurance temporarily and went without medications for roughly 1 month. He is back on his [place]   He continues to maintain perfect virological suppression.  He has no complaints today.  He still has a bit of hypertension and I was going to initiate treatment but he would like to lose weight first.  I think given his history I think changing him from Triumeq to [place] makes a lot of sense.  We will make that switch today and start to fill through [facility] and have the meds mailed to his house.  We will recheck viral load and CD4 in 2 months time  Past Medical History:  Diagnosis Date  . AKI (acute kidney injury) ([place]) 07/10/2016  . HIV infection ([place])   . Screen for STD (sexually transmitted disease) 09/09/2014  . Smoker 11/23/2014    No past surgical history on file.  Family History  Problem Relation Age of Onset  . Cancer Mother   . Fibromyalgia Mother   . Hypertension Mother       Social History   Socioeconomic History  . Marital status: Single    Spouse name: Not on file  . Number of children: Not on file  . Years of education: Not on file  . Highest education level: Not on file  Occupational History  . Not on file  Social Needs  . Financial resource strain: Not on file  . Food insecurity    Worry: Not on file    Inability: Not on file  . Transportation needs    Medical: Not on file    Non-medical: Not on file  Tobacco Use  . Smoking status: Former  Smoker    Quit date: 06/21/2015    Years since quitting: 3.2  . Smokeless tobacco: Never Used  . Tobacco comment: 4 x weekly  Substance and Sexual Activity  . Alcohol use: Yes    Alcohol/week: 0.0 standard drinks    Comment: social  . Drug use: Yes    Frequency: 7.0 times per week    Types: Marijuana    Comment: daily  . Sexual activity: Yes    Partners: Male    Birth control/protection: Condom    Comment: pt declined condoms  Lifestyle  . Physical activity    Days per week: Not on file    Minutes per session: Not on file  . Stress: Not on file  Relationships  . Social Herbalist on phone: Not on file    Gets together: Not on file    Attends religious service: Not on file    Active member of club or organization: Not on file    Attends meetings of clubs or organizations: Not on file    Relationship status: Not on file  Other Topics Concern  . Not on file  Social History Narrative  . Not on file  Review of Systems  Constitutional: Negative for activity change, appetite change, chills, diaphoresis, fatigue, fever and unexpected weight change.  HENT: Negative for congestion, rhinorrhea, sinus pressure, sneezing, sore throat and trouble swallowing.   Eyes: Negative for photophobia and visual disturbance.  Respiratory: Negative for cough, chest tightness, shortness of breath, wheezing and stridor.   Cardiovascular: Negative for chest pain, palpitations and leg swelling.  Gastrointestinal: Negative for abdominal distention, abdominal pain, anal bleeding, blood in stool, constipation, diarrhea, nausea and vomiting.  Genitourinary: Negative for difficulty urinating, dysuria, flank pain and hematuria.  Musculoskeletal: Negative for arthralgias, back pain, gait problem and myalgias.  Skin: Negative for color change, pallor, rash and wound.  Neurological: Negative for dizziness, tremors, weakness and light-headedness.  Hematological: Negative for adenopathy. Does not  bruise/bleed easily.  Psychiatric/Behavioral: Negative for agitation, behavioral problems, confusion, decreased concentration, dysphoric mood and sleep disturbance.           Objective:   Physical Exam  Constitutional: He is oriented to person, place, and time. He appears well-developed and well-nourished.  HENT:  Head: Normocephalic and atraumatic.  Eyes: Conjunctivae and EOM are normal.  Neck: Normal range of motion. Neck supple. No JVD present.  Cardiovascular: Normal rate and regular rhythm.  Pulmonary/Chest: Effort normal. No respiratory distress. He has no wheezes.  Abdominal: Soft. He exhibits no distension.  Musculoskeletal: Normal range of motion.        General: No tenderness or edema.  Neurological: He is alert and oriented to person, place, and time.  Skin: Skin is warm and dry. No rash noted. No erythema. No pallor.  Psychiatric: He has a normal mood and affect. His behavior is normal. Judgment and thought content normal.        Assessment & Plan:   HIV: Perfect control, change to DOVATO and in him makes esp good since to minimize drugs we do not need and get rid of ABC  Recheck viral load in 2 months   Pre-HTN: BP up at times.  I have asked him to try and lose about 20 pounds of weight.  If he can do this blood pressure may improve if not we will need to start antihypertensive     .

## 2018-10-16 MED FILL — DOVATO 50-300 MG TABS: 50-300 | 30 days supply | Qty: 30 | Fill #1

## 2018-11-06 ENCOUNTER — Encounter: Payer: Self-pay | Admitting: Infectious Disease

## 2018-11-06 ENCOUNTER — Ambulatory Visit (INDEPENDENT_AMBULATORY_CARE_PROVIDER_SITE_OTHER): Payer: Managed Care, Other (non HMO) | Admitting: Infectious Disease

## 2018-11-06 ENCOUNTER — Other Ambulatory Visit: Payer: Self-pay

## 2018-11-06 ENCOUNTER — Other Ambulatory Visit: Payer: Self-pay | Admitting: *Deleted

## 2018-11-06 ENCOUNTER — Ambulatory Visit: Payer: Self-pay

## 2018-11-06 VITALS — BP 144/90 | HR 66

## 2018-11-06 DIAGNOSIS — B20 Human immunodeficiency virus [HIV] disease: Secondary | ICD-10-CM

## 2018-11-06 DIAGNOSIS — I10 Essential (primary) hypertension: Secondary | ICD-10-CM

## 2018-11-06 DIAGNOSIS — Z23 Encounter for immunization: Secondary | ICD-10-CM

## 2018-11-06 MED ORDER — DOVATO 50-300 MG PO TABS: 1.0000 | ORAL_TABLET | Freq: Every day | ORAL | 11 refills | Status: DC

## 2018-11-06 NOTE — Progress Notes (Signed)
Chief complaint: Follow up for HIV on medications  Subjective:    Patient ID: Darrell [name], male    DOB: September 22, 1990, 28 y.o.   MRN: [id number]  HPI   28 year old [place] man with HIV, who tested positive in July 2015 after negative test in June of 2015. He has HIV + partner. His VL at intake was in the 300s and CD4 above 400.  No resistance data back. HLA b701 negative.Since then he has fully suppress his virus on TRIUMEQ and maintains a healthy CD4 count. He did change jobs and lost insurance temporarily and went without medications for roughly 1 month. He is back on his [place]   He continues to maintain perfect virological suppression.  Last visit I changed him to [facility] and he is done quite well and had no problems tolerating it which is unsurprising.  He is here for repeat blood work today.  He is in good spirits he is been monitoring his blood pressure at home which is normally been in the normal range of 120s at the highest.  Likely his blood pressure is more than anything else whitecoat hypertension  Past Medical History:  Diagnosis Date  . AKI (acute kidney injury) ([place]) 07/10/2016  . HIV infection ([place])   . HTN (hypertension) 09/05/2018  . Screen for STD (sexually transmitted disease) 09/09/2014  . Smoker 11/23/2014    No past surgical history on file.  Family History  Problem Relation Age of Onset  . Cancer Mother   . Fibromyalgia Mother   . Hypertension Mother       Social History   Socioeconomic History  . Marital status: Single    Spouse name: Not on file  . Number of children: Not on file  . Years of education: Not on file  . Highest education level: Not on file  Occupational History  . Not on file  Social Needs  . Financial resource strain: Not on file  . Food insecurity    Worry: Not on file    Inability: Not on file  . Transportation needs    Medical: Not on file    Non-medical: Not on file  Tobacco Use  . Smoking status: Former  Smoker    Quit date: 06/21/2015    Years since quitting: 3.3  . Smokeless tobacco: Never Used  . Tobacco comment: 4 x weekly  Substance and Sexual Activity  . Alcohol use: Yes    Alcohol/week: 0.0 standard drinks    Comment: social  . Drug use: Yes    Frequency: 7.0 times per week    Types: Marijuana    Comment: daily  . Sexual activity: Yes    Partners: Male    Birth control/protection: Condom    Comment: pt declined condoms  Lifestyle  . Physical activity    Days per week: Not on file    Minutes per session: Not on file  . Stress: Not on file  Relationships  . Social Herbalist on phone: Not on file    Gets together: Not on file    Attends religious service: Not on file    Active member of club or organization: Not on file    Attends meetings of clubs or organizations: Not on file    Relationship status: Not on file  Other Topics Concern  . Not on file  Social History Narrative  . Not on file      Review of Systems  Constitutional:  Negative for activity change, appetite change, chills, diaphoresis, fatigue, fever and unexpected weight change.  HENT: Negative for congestion, rhinorrhea, sinus pressure, sneezing, sore throat and trouble swallowing.   Eyes: Negative for photophobia and visual disturbance.  Respiratory: Negative for cough, chest tightness, shortness of breath, wheezing and stridor.   Cardiovascular: Negative for chest pain, palpitations and leg swelling.  Gastrointestinal: Negative for abdominal distention, abdominal pain, anal bleeding, blood in stool, constipation, diarrhea, nausea and vomiting.  Genitourinary: Negative for difficulty urinating, dysuria, flank pain and hematuria.  Musculoskeletal: Negative for arthralgias, back pain, gait problem and myalgias.  Skin: Negative for color change, pallor, rash and wound.  Neurological: Negative for dizziness, tremors, weakness and light-headedness.  Hematological: Negative for adenopathy. Does not  bruise/bleed easily.  Psychiatric/Behavioral: Negative for agitation, behavioral problems, confusion, decreased concentration, dysphoric mood and sleep disturbance.           Objective:   Physical Exam Constitutional:      Appearance: He is well-developed.  HENT:     Head: Normocephalic and atraumatic.  Eyes:     Conjunctiva/sclera: Conjunctivae normal.  Neck:     Musculoskeletal: Normal range of motion and neck supple.     Vascular: No JVD.  Cardiovascular:     Rate and Rhythm: Normal rate and regular rhythm.  Pulmonary:     Effort: Pulmonary effort is normal. No respiratory distress.     Breath sounds: No wheezing.  Abdominal:     General: There is no distension.     Palpations: Abdomen is soft.  Musculoskeletal: Normal range of motion.        General: No tenderness.  Skin:    General: Skin is warm and dry.     Coloration: Skin is not pale.     Findings: No erythema or rash.  Neurological:     Mental Status: He is alert and oriented to person, place, and time.  Psychiatric:        Behavior: Behavior normal.        Thought Content: Thought content normal.        Judgment: Judgment normal.         Assessment & Plan:   HIV: Continue with DOVATO and check labs today Recheck viral load rosY  Pre-HTN: BP up at times.,  Seems more likely whitecoat hypertension    .

## 2018-11-08 LAB — HELPER T-LYMPH-CD4 (ARMC ONLY)
% CD 4 Pos. Lymph.: 38.8 % (ref 30.8–58.5)
Absolute CD 4 Helper: 660 /uL (ref 359–1519)
Basophils Absolute: 0.1 10*3/uL (ref 0.0–0.2)
Basos: 1 %
EOS (ABSOLUTE): 0.1 10*3/uL (ref 0.0–0.4)
Eos: 2 %
Hematocrit: 47.2 % (ref 37.5–51.0)
Hemoglobin: 16.1 g/dL (ref 13.0–17.7)
Immature Grans (Abs): 0 10*3/uL (ref 0.0–0.1)
Immature Granulocytes: 0 %
Lymphocytes Absolute: 1.7 10*3/uL (ref 0.7–3.1)
Lymphs: 45 %
MCH: 31.8 pg (ref 26.6–33.0)
MCHC: 34.1 g/dL (ref 31.5–35.7)
MCV: 93 fL (ref 79–97)
Monocytes Absolute: 0.5 10*3/uL (ref 0.1–0.9)
Monocytes: 13 %
Neutrophils Absolute: 1.5 10*3/uL (ref 1.4–7.0)
Neutrophils: 39 %
Platelets: 294 10*3/uL (ref 150–450)
RBC: 5.06 x10E6/uL (ref 4.14–5.80)
RDW: 12.7 % (ref 11.6–15.4)
WBC: 3.8 10*3/uL (ref 3.4–10.8)

## 2018-11-10 LAB — HIV-1 RNA QUANT-NO REFLEX-BLD
HIV 1 RNA Quant: <20 copies/mL
HIV-1 RNA Quant, Log: <1.3 Log copies/mL

## 2018-11-21 ENCOUNTER — Other Ambulatory Visit: Payer: Self-pay | Admitting: Pharmacist

## 2018-11-21 DIAGNOSIS — B20 Human immunodeficiency virus [HIV] disease: Secondary | ICD-10-CM

## 2018-11-21 MED ORDER — DOVATO 50-300 MG PO TABS: 1.0000 | ORAL_TABLET | Freq: Every day | ORAL | 11 refills | Status: DC

## 2018-11-21 NOTE — Progress Notes (Signed)
Sending Dovato to [facility] as patient has HMAP approval.

## 2018-11-22 NOTE — Telephone Encounter (Addendum)
[facility] Patient Advocate Encounter  Received notification this morning from [facility] that the patient's ViiV coverage had been terminated. Upon further investigation, it was determined that the patient is now UMAP approved and will need to have his medication filled at [facility]. I called the patient and updated him on the new pharmacy process he would have to follow to get his medication at no charge. He will pick up the medication in person this time and see if they can mail next time due to distance between [place] and his home. He knows to call the clinic if he runs into any trouble getting his medication.   His current insurance copay is $0, will call first of the year to provide them with his ViiV copay card information.   Bartholomew Crews, CPhT Specialty Pharmacy Patient [facility] for Infectious Disease Phone: [phone] Fax: [phone] 11/22/2018 11:55 AM

## 2019-03-04 ENCOUNTER — Other Ambulatory Visit: Payer: Self-pay

## 2019-03-19 ENCOUNTER — Encounter: Payer: Self-pay | Admitting: Infectious Disease

## 2019-04-01 ENCOUNTER — Other Ambulatory Visit: Payer: Self-pay

## 2019-04-01 ENCOUNTER — Ambulatory Visit: Payer: Self-pay

## 2019-04-01 DIAGNOSIS — I10 Essential (primary) hypertension: Secondary | ICD-10-CM

## 2019-04-01 DIAGNOSIS — B20 Human immunodeficiency virus [HIV] disease: Secondary | ICD-10-CM

## 2019-04-02 LAB — T-HELPER CELL (CD4) - (RCID CLINIC ONLY)
CD4 % Helper T Cell: 39 % (ref 33–65)
CD4 T Cell Abs: 598 /uL (ref 400–1790)

## 2019-04-05 LAB — COMPLETE METABOLIC PANEL WITH GFR
AG Ratio: 1.5 (calc) (ref 1.0–2.5)
ALT: 17 U/L (ref 9–46)
AST: 17 U/L (ref 10–40)
Albumin: 4.1 g/dL (ref 3.6–5.1)
Alkaline phosphatase (APISO): 52 U/L (ref 36–130)
BUN: 11 mg/dL (ref 7–25)
CO2: 29 mmol/L (ref 20–32)
Calcium: 9.3 mg/dL (ref 8.6–10.3)
Chloride: 105 mmol/L (ref 98–110)
Creat: 1.21 mg/dL (ref 0.60–1.35)
GFR, Est African American: 93 mL/min/{1.73_m2} (ref >=60)
GFR, Est Non African American: 80 mL/min/{1.73_m2} (ref >=60)
Globulin: 2.8 g/dL (calc) (ref 1.9–3.7)
Glucose, Bld: 96 mg/dL (ref 65–99)
Potassium: 4.2 mmol/L (ref 3.5–5.3)
Sodium: 140 mmol/L (ref 135–146)
Total Bilirubin: 0.6 mg/dL (ref 0.2–1.2)
Total Protein: 6.9 g/dL (ref 6.1–8.1)

## 2019-04-05 LAB — CBC WITH DIFFERENTIAL/PLATELET
Absolute Monocytes: 578 cells/uL (ref 200–950)
Basophils Absolute: 39 cells/uL (ref 0–200)
Basophils Relative: 0.7 %
Eosinophils Absolute: 132 cells/uL (ref 15–500)
Eosinophils Relative: 2.4 %
HCT: 41.7 % (ref 38.5–50.0)
Hemoglobin: 14.6 g/dL (ref 13.2–17.1)
Lymphs Abs: 1408 cells/uL (ref 850–3900)
MCH: 31.5 pg (ref 27.0–33.0)
MCHC: 35 g/dL (ref 32.0–36.0)
MCV: 90.1 fL (ref 80.0–100.0)
MPV: 8.9 fL (ref 7.5–12.5)
Monocytes Relative: 10.5 %
Neutro Abs: 3344 cells/uL (ref 1500–7800)
Neutrophils Relative %: 60.8 %
Platelets: 283 10*3/uL (ref 140–400)
RBC: 4.63 10*6/uL (ref 4.20–5.80)
RDW: 12.3 % (ref 11.0–15.0)
Total Lymphocyte: 25.6 %
WBC: 5.5 10*3/uL (ref 3.8–10.8)

## 2019-04-05 LAB — LIPID PANEL
Cholesterol: 129 mg/dL (ref <200)
HDL: 45 mg/dL (ref >=40)
LDL Cholesterol (Calc): 66 mg/dL (calc)
Non-HDL Cholesterol (Calc): 84 mg/dL (calc) (ref <130)
Total CHOL/HDL Ratio: 2.9 (calc) (ref <5.0)
Triglycerides: 92 mg/dL (ref <150)

## 2019-04-05 LAB — RPR: RPR Ser Ql: NONREACTIVE

## 2019-04-05 LAB — HIV-1 RNA QUANT-NO REFLEX-BLD
HIV 1 RNA Quant: <20 copies/mL
HIV-1 RNA Quant, Log: <1.3 Log copies/mL

## 2019-04-17 ENCOUNTER — Encounter: Payer: Self-pay | Admitting: Infectious Disease

## 2019-04-21 ENCOUNTER — Other Ambulatory Visit: Payer: Self-pay

## 2019-04-21 ENCOUNTER — Ambulatory Visit (INDEPENDENT_AMBULATORY_CARE_PROVIDER_SITE_OTHER): Payer: Self-pay | Admitting: Infectious Disease

## 2019-04-21 ENCOUNTER — Encounter: Payer: Self-pay | Admitting: Infectious Disease

## 2019-04-21 VITALS — BP 130/82 | HR 66 | Temp 98.1°F | Ht 71.0 in | Wt 191.0 lb

## 2019-04-21 DIAGNOSIS — Z23 Encounter for immunization: Secondary | ICD-10-CM

## 2019-04-21 DIAGNOSIS — Z113 Encounter for screening for infections with a predominantly sexual mode of transmission: Secondary | ICD-10-CM

## 2019-04-21 DIAGNOSIS — B20 Human immunodeficiency virus [HIV] disease: Secondary | ICD-10-CM

## 2019-04-21 NOTE — Progress Notes (Signed)
Chief complaint: Follow up for HIV on medications  Subjective:    Patient ID: Darrell [name], male    DOB: 25-May-1990, 29 y.o.   MRN: [id number]  HPI  29 year old [place] American man with HIV, who tested positive in July 2015 after negative test in June of 2015. He has HIV + partner. His VL at intake was in the 300s and CD4 above 400.  No resistance data back. HLA b701 negative.Since then he has fully suppress his virus on TRIUMEQ and maintains a healthy CD4 count. He did change jobs and lost insurance temporarily and went without medications for roughly 1 month.  We have transitioned him from Triumeq to [facility] with perfect virological suppression.  He has renewed his HMA P program and I informed him that he could also use this program to obtain an [facility] health insurance plan.    Past Medical History:  Diagnosis Date  . AKI (acute kidney injury) ([place]) 07/10/2016  . HIV infection ([place])   . HTN (hypertension) 09/05/2018  . Screen for STD (sexually transmitted disease) 09/09/2014  . Smoker 11/23/2014    No past surgical history on file.  Family History  Problem Relation Age of Onset  . Cancer Mother   . Fibromyalgia Mother   . Hypertension Mother       Social History   Socioeconomic History  . Marital status: Single    Spouse name: Not on file  . Number of children: Not on file  . Years of education: Not on file  . Highest education level: Not on file  Occupational History  . Not on file  Tobacco Use  . Smoking status: Former Smoker    Quit date: 06/21/2015    Years since quitting: 3.8  . Smokeless tobacco: Never Used  . Tobacco comment: 4 x weekly  Substance and Sexual Activity  . Alcohol use: Yes    Alcohol/week: 0.0 standard drinks    Comment: social  . Drug use: Yes    Frequency: 7.0 times per week    Types: Marijuana    Comment: daily  . Sexual activity: Yes    Partners: Male    Birth control/protection: Condom    Comment: pt given condoms  Other Topics  Concern  . Not on file  Social History Narrative  . Not on file   Social Determinants of Health   Financial Resource Strain:   . Difficulty of Paying Living Expenses: Not on file  Food Insecurity:   . Worried About Charity fundraiser in the Last Year: Not on file  . Ran Out of Food in the Last Year: Not on file  Transportation Needs:   . Lack of Transportation (Medical): Not on file  . Lack of Transportation (Non-Medical): Not on file  Physical Activity:   . Days of Exercise per Week: Not on file  . Minutes of Exercise per Session: Not on file  Stress:   . Feeling of Stress : Not on file  Social Connections:   . Frequency of Communication with Friends and Family: Not on file  . Frequency of Social Gatherings with Friends and Family: Not on file  . Attends Religious Services: Not on file  . Active Member of Clubs or Organizations: Not on file  . Attends Archivist Meetings: Not on file  . Marital Status: Not on file      Review of Systems  Constitutional: Negative for activity change, appetite change, chills, diaphoresis, fatigue, fever and  unexpected weight change.  HENT: Negative for congestion, rhinorrhea, sinus pressure, sneezing, sore throat and trouble swallowing.   Eyes: Negative for photophobia and visual disturbance.  Respiratory: Negative for cough, chest tightness, shortness of breath, wheezing and stridor.   Cardiovascular: Negative for chest pain, palpitations and leg swelling.  Gastrointestinal: Negative for abdominal distention, abdominal pain, anal bleeding, blood in stool, constipation, diarrhea, nausea and vomiting.  Genitourinary: Negative for difficulty urinating, dysuria, flank pain and hematuria.  Musculoskeletal: Negative for arthralgias, back pain, gait problem and myalgias.  Skin: Negative for color change, pallor, rash and wound.  Neurological: Negative for dizziness, tremors, weakness and light-headedness.  Hematological: Negative for  adenopathy. Does not bruise/bleed easily.  Psychiatric/Behavioral: Negative for agitation, behavioral problems, confusion, decreased concentration, dysphoric mood and sleep disturbance.           Objective:   Physical Exam Constitutional:      Appearance: He is well-developed.  HENT:     Head: Normocephalic and atraumatic.  Eyes:     Conjunctiva/sclera: Conjunctivae normal.  Neck:     Vascular: No JVD.  Cardiovascular:     Rate and Rhythm: Normal rate and regular rhythm.  Pulmonary:     Effort: Pulmonary effort is normal. No respiratory distress.     Breath sounds: No wheezing.  Abdominal:     General: There is no distension.     Palpations: Abdomen is soft.  Musculoskeletal:        General: No tenderness. Normal range of motion.     Cervical back: Normal range of motion and neck supple.  Skin:    General: Skin is warm and dry.     Coloration: Skin is not pale.     Findings: No erythema or rash.  Neurological:     Mental Status: He is alert and oriented to person, place, and time.  Psychiatric:        Behavior: Behavior normal.        Thought Content: Thought content normal.        Judgment: Judgment normal.         Assessment & Plan:   HIV: Continue with DOVATO and he can return to clinic in July for labs and renewal of HMA P    .

## 2019-06-23 ENCOUNTER — Ambulatory Visit: Payer: Self-pay

## 2019-06-24 ENCOUNTER — Ambulatory Visit: Payer: Self-pay

## 2019-07-01 ENCOUNTER — Ambulatory Visit: Payer: Self-pay

## 2019-07-07 ENCOUNTER — Other Ambulatory Visit: Payer: Self-pay

## 2019-07-07 ENCOUNTER — Ambulatory Visit (INDEPENDENT_AMBULATORY_CARE_PROVIDER_SITE_OTHER): Payer: Self-pay

## 2019-07-07 DIAGNOSIS — Z23 Encounter for immunization: Secondary | ICD-10-CM

## 2019-09-25 ENCOUNTER — Other Ambulatory Visit: Payer: Self-pay

## 2019-10-13 ENCOUNTER — Encounter: Payer: Self-pay | Admitting: Infectious Disease

## 2019-10-22 ENCOUNTER — Other Ambulatory Visit: Payer: Self-pay

## 2019-10-22 ENCOUNTER — Ambulatory Visit: Payer: Self-pay

## 2019-10-23 ENCOUNTER — Other Ambulatory Visit: Payer: Self-pay

## 2019-10-23 ENCOUNTER — Ambulatory Visit: Payer: Self-pay

## 2019-10-23 ENCOUNTER — Ambulatory Visit (INDEPENDENT_AMBULATORY_CARE_PROVIDER_SITE_OTHER): Payer: Self-pay

## 2019-10-23 DIAGNOSIS — Z23 Encounter for immunization: Secondary | ICD-10-CM

## 2019-10-23 DIAGNOSIS — B20 Human immunodeficiency virus [HIV] disease: Secondary | ICD-10-CM

## 2019-10-24 ENCOUNTER — Encounter: Payer: Self-pay | Admitting: Infectious Disease

## 2019-10-24 LAB — T-HELPER CELL (CD4) - (RCID CLINIC ONLY)
CD4 % Helper T Cell: 36 % (ref 33–65)
CD4 T Cell Abs: 738 /uL (ref 400–1790)

## 2019-10-25 LAB — HIV-1 RNA QUANT-NO REFLEX-BLD
HIV 1 RNA Quant: 23 Copies/mL — ABNORMAL HIGH
HIV-1 RNA Quant, Log: 1.36 Log cps/mL — ABNORMAL HIGH

## 2019-10-25 LAB — CBC WITH DIFFERENTIAL/PLATELET
Absolute Monocytes: 480 cells/uL (ref 200–950)
Basophils Absolute: 28 cells/uL (ref 0–200)
Basophils Relative: 0.7 %
Eosinophils Absolute: 88 cells/uL (ref 15–500)
Eosinophils Relative: 2.2 %
HCT: 44.5 % (ref 38.5–50.0)
Hemoglobin: 15.6 g/dL (ref 13.2–17.1)
Lymphs Abs: 2036 cells/uL (ref 850–3900)
MCH: 31.3 pg (ref 27.0–33.0)
MCHC: 35.1 g/dL (ref 32.0–36.0)
MCV: 89.2 fL (ref 80.0–100.0)
MPV: 8.7 fL (ref 7.5–12.5)
Monocytes Relative: 12 %
Neutro Abs: 1368 cells/uL — ABNORMAL LOW (ref 1500–7800)
Neutrophils Relative %: 34.2 %
Platelets: 312 10*3/uL (ref 140–400)
RBC: 4.99 10*6/uL (ref 4.20–5.80)
RDW: 12.8 % (ref 11.0–15.0)
Total Lymphocyte: 50.9 %
WBC: 4 10*3/uL (ref 3.8–10.8)

## 2019-10-25 LAB — COMPLETE METABOLIC PANEL WITH GFR
AG Ratio: 1.6 (calc) (ref 1.0–2.5)
ALT: 15 U/L (ref 9–46)
AST: 20 U/L (ref 10–40)
Albumin: 4.2 g/dL (ref 3.6–5.1)
Alkaline phosphatase (APISO): 44 U/L (ref 36–130)
BUN/Creatinine Ratio: 8 (calc) (ref 6–22)
BUN: 13 mg/dL (ref 7–25)
CO2: 30 mmol/L (ref 20–32)
Calcium: 9.3 mg/dL (ref 8.6–10.3)
Chloride: 105 mmol/L (ref 98–110)
Creat: 1.56 mg/dL — ABNORMAL HIGH (ref 0.60–1.35)
GFR, Est African American: 68 mL/min/{1.73_m2} (ref >=60)
GFR, Est Non African American: 58 mL/min/{1.73_m2} — ABNORMAL LOW (ref >=60)
Globulin: 2.6 g/dL (calc) (ref 1.9–3.7)
Glucose, Bld: 88 mg/dL (ref 65–99)
Potassium: 4.5 mmol/L (ref 3.5–5.3)
Sodium: 140 mmol/L (ref 135–146)
Total Bilirubin: 0.8 mg/dL (ref 0.2–1.2)
Total Protein: 6.8 g/dL (ref 6.1–8.1)

## 2019-10-25 LAB — RPR: RPR Ser Ql: NONREACTIVE

## 2019-10-31 ENCOUNTER — Telehealth: Payer: Self-pay

## 2019-10-31 NOTE — Telephone Encounter (Signed)
COVID-19 Pre-Screening Questions:10/31/19 ° °Do you currently have a fever (>100 °F), chills or unexplained body aches? NO ° °Are you currently experiencing new cough, shortness of breath, sore throat, runny nose?NO °•  °Have you been in contact with someone that is currently pending confirmation of Covid19 testing or has been confirmed to have the Covid19 virus?  NO ° °I have advised patient to call back to reschedule or convert to a MyChart visit if any Covid symptoms appear during the weekend. ° °**If the patient answers NO to ALL questions -  advise the patient to please call the clinic before coming to the office should any symptoms develop.  ° ° °

## 2019-11-03 ENCOUNTER — Ambulatory Visit (INDEPENDENT_AMBULATORY_CARE_PROVIDER_SITE_OTHER): Payer: Self-pay | Admitting: Infectious Disease

## 2019-11-03 ENCOUNTER — Encounter: Payer: Self-pay | Admitting: Infectious Disease

## 2019-11-03 ENCOUNTER — Other Ambulatory Visit: Payer: Self-pay

## 2019-11-03 VITALS — BP 136/84 | HR 78 | Temp 98.0°F | Ht 71.0 in | Wt 195.0 lb

## 2019-11-03 DIAGNOSIS — Z23 Encounter for immunization: Secondary | ICD-10-CM

## 2019-11-03 DIAGNOSIS — B20 Human immunodeficiency virus [HIV] disease: Secondary | ICD-10-CM

## 2019-11-03 DIAGNOSIS — N181 Chronic kidney disease, stage 1: Secondary | ICD-10-CM

## 2019-11-03 NOTE — Patient Instructions (Signed)
Darrell [name] will get in touch re CABENUVA

## 2019-11-03 NOTE — Progress Notes (Signed)
Chief complaint: Follow up for HIV on medications concerned re Cr being elevated  Subjective:    Patient ID: Darrell [name], male    DOB: 01-Oct-1990, 29 y.o.   MRN: [id number]  HPI  29 year old [place] American man with HIV, who tested positive in July 2015 after negative test in June of 2015. He has HIV + partner. His VL at intake was in the 300s and CD4 above 400.  No resistance data back. HLA b701 negative.Since then he has fully suppress his virus on TRIUMEQ --> DOVATO. He is interested in [place]  Past Medical History:  Diagnosis Date  . AKI (acute kidney injury) ([place]) 07/10/2016  . HIV infection ([place])   . HTN (hypertension) 09/05/2018  . Screen for STD (sexually transmitted disease) 09/09/2014  . Smoker 11/23/2014    No past surgical history on file.  Family History  Problem Relation Age of Onset  . Cancer Mother   . Fibromyalgia Mother   . Hypertension Mother       Social History   Socioeconomic History  . Marital status: Single    Spouse name: Not on file  . Number of children: Not on file  . Years of education: Not on file  . Highest education level: Not on file  Occupational History  . Not on file  Tobacco Use  . Smoking status: Light Tobacco Smoker    Last attempt to quit: 06/21/2015    Years since quitting: 4.3  . Smokeless tobacco: Never Used  . Tobacco comment: socially  Substance and Sexual Activity  . Alcohol use: Yes    Alcohol/week: 0.0 standard drinks    Comment: social  . Drug use: Yes    Frequency: 7.0 times per week    Types: Marijuana    Comment: daily  . Sexual activity: Yes    Partners: Male    Birth control/protection: Condom    Comment: pt declined condoms 11/03/19  Other Topics Concern  . Not on file  Social History Narrative  . Not on file   Social Determinants of Health   Financial Resource Strain:   . Difficulty of Paying Living Expenses: Not on file  Food Insecurity:   . Worried About Charity fundraiser in the Last  Year: Not on file  . Ran Out of Food in the Last Year: Not on file  Transportation Needs:   . Lack of Transportation (Medical): Not on file  . Lack of Transportation (Non-Medical): Not on file  Physical Activity:   . Days of Exercise per Week: Not on file  . Minutes of Exercise per Session: Not on file  Stress:   . Feeling of Stress : Not on file  Social Connections:   . Frequency of Communication with Friends and Family: Not on file  . Frequency of Social Gatherings with Friends and Family: Not on file  . Attends Religious Services: Not on file  . Active Member of Clubs or Organizations: Not on file  . Attends Archivist Meetings: Not on file  . Marital Status: Not on file      Review of Systems  Constitutional: Negative for activity change, appetite change, chills, diaphoresis, fatigue, fever and unexpected weight change.  HENT: Negative for congestion, rhinorrhea, sinus pressure, sneezing, sore throat and trouble swallowing.   Eyes: Negative for photophobia and visual disturbance.  Respiratory: Negative for cough, chest tightness, shortness of breath, wheezing and stridor.   Cardiovascular: Negative for chest pain, palpitations and leg  swelling.  Gastrointestinal: Negative for abdominal distention, abdominal pain, anal bleeding, blood in stool, constipation, diarrhea, nausea and vomiting.  Genitourinary: Negative for difficulty urinating, dysuria, flank pain and hematuria.  Musculoskeletal: Negative for arthralgias, back pain, gait problem and myalgias.  Skin: Negative for color change, pallor, rash and wound.  Neurological: Negative for dizziness, tremors, weakness and light-headedness.  Hematological: Negative for adenopathy. Does not bruise/bleed easily.  Psychiatric/Behavioral: Negative for agitation, behavioral problems, confusion, decreased concentration, dysphoric mood and sleep disturbance.           Objective:   Physical Exam Constitutional:       Appearance: He is well-developed.  HENT:     Head: Normocephalic and atraumatic.  Eyes:     Conjunctiva/sclera: Conjunctivae normal.  Neck:     Vascular: No JVD.  Cardiovascular:     Rate and Rhythm: Normal rate and regular rhythm.  Pulmonary:     Effort: Pulmonary effort is normal. No respiratory distress.     Breath sounds: No wheezing.  Abdominal:     General: There is no distension.     Palpations: Abdomen is soft.  Musculoskeletal:        General: No tenderness. Normal range of motion.     Cervical back: Normal range of motion and neck supple.  Skin:    General: Skin is warm and dry.     Coloration: Skin is not pale.     Findings: No erythema or rash.  Neurological:     Mental Status: He is alert and oriented to person, place, and time.  Psychiatric:        Behavior: Behavior normal.        Thought Content: Thought content normal.        Judgment: Judgment normal.         Assessment & Plan:   HIV: Continue with DOVATO and he is interested in [facility]  Had him meet w Cassie and reviewed how CABENUVA is given and how he would have to take food and avoid acid suppression for the oral lead in.  . Creatinine up: has been up before. I am rechecking BMP and urine lytes

## 2019-12-10 ENCOUNTER — Telehealth: Payer: Self-pay

## 2019-12-10 NOTE — Telephone Encounter (Signed)
My Chart Cabenuva Message  

## 2020-02-03 ENCOUNTER — Telehealth: Payer: Self-pay | Admitting: Pharmacist

## 2020-02-03 NOTE — Telephone Encounter (Signed)
Called patient to discuss starting Cabenuva. No answer, left HIPAA compliant VM to call me back to schedule an appointment with me to start oral lead-in therapy.  

## 2020-03-18 ENCOUNTER — Other Ambulatory Visit: Payer: Self-pay

## 2020-03-18 DIAGNOSIS — Z79899 Other long term (current) drug therapy: Secondary | ICD-10-CM

## 2020-03-18 DIAGNOSIS — Z113 Encounter for screening for infections with a predominantly sexual mode of transmission: Secondary | ICD-10-CM

## 2020-03-18 DIAGNOSIS — B20 Human immunodeficiency virus [HIV] disease: Secondary | ICD-10-CM

## 2020-03-31 ENCOUNTER — Encounter: Payer: Self-pay | Admitting: Infectious Disease

## 2020-04-23 ENCOUNTER — Other Ambulatory Visit: Payer: Self-pay

## 2020-04-23 DIAGNOSIS — B20 Human immunodeficiency virus [HIV] disease: Secondary | ICD-10-CM

## 2020-04-23 DIAGNOSIS — Z79899 Other long term (current) drug therapy: Secondary | ICD-10-CM

## 2020-04-26 LAB — COMPLETE METABOLIC PANEL WITH GFR
AG Ratio: 1.7 (calc) (ref 1.0–2.5)
ALT: 16 U/L (ref 9–46)
AST: 23 U/L (ref 10–40)
Albumin: 4.3 g/dL (ref 3.6–5.1)
Alkaline phosphatase (APISO): 42 U/L (ref 36–130)
BUN/Creatinine Ratio: 12 (calc) (ref 6–22)
BUN: 18 mg/dL (ref 7–25)
CO2: 28 mmol/L (ref 20–32)
Calcium: 9.4 mg/dL (ref 8.6–10.3)
Chloride: 104 mmol/L (ref 98–110)
Creat: 1.52 mg/dL — ABNORMAL HIGH (ref 0.60–1.35)
GFR, Est African American: 70 mL/min/{1.73_m2} (ref >=60)
GFR, Est Non African American: 60 mL/min/{1.73_m2} (ref >=60)
Globulin: 2.5 g/dL (calc) (ref 1.9–3.7)
Glucose, Bld: 106 mg/dL — ABNORMAL HIGH (ref 65–99)
Potassium: 4 mmol/L (ref 3.5–5.3)
Sodium: 139 mmol/L (ref 135–146)
Total Bilirubin: 1.1 mg/dL (ref 0.2–1.2)
Total Protein: 6.8 g/dL (ref 6.1–8.1)

## 2020-04-26 LAB — CBC WITH DIFFERENTIAL/PLATELET
Absolute Monocytes: 445 cells/uL (ref 200–950)
Basophils Absolute: 29 cells/uL (ref 0–200)
Basophils Relative: 0.9 %
Eosinophils Absolute: 42 cells/uL (ref 15–500)
Eosinophils Relative: 1.3 %
HCT: 43 % (ref 38.5–50.0)
Hemoglobin: 14.8 g/dL (ref 13.2–17.1)
Lymphs Abs: 1478 cells/uL (ref 850–3900)
MCH: 31 pg (ref 27.0–33.0)
MCHC: 34.4 g/dL (ref 32.0–36.0)
MCV: 90.1 fL (ref 80.0–100.0)
MPV: 8.9 fL (ref 7.5–12.5)
Monocytes Relative: 13.9 %
Neutro Abs: 1206 cells/uL — ABNORMAL LOW (ref 1500–7800)
Neutrophils Relative %: 37.7 %
Platelets: 273 10*3/uL (ref 140–400)
RBC: 4.77 10*6/uL (ref 4.20–5.80)
RDW: 13 % (ref 11.0–15.0)
Total Lymphocyte: 46.2 %
WBC: 3.2 10*3/uL — ABNORMAL LOW (ref 3.8–10.8)

## 2020-04-26 LAB — T-HELPER CELLS (CD4) COUNT (NOT AT ARMC)
Absolute CD4: 754 cells/uL (ref 490–1740)
CD4 T Helper %: 42 % (ref 30–61)
Total lymphocyte count: 1796 cells/uL (ref 850–3900)

## 2020-04-26 LAB — LIPID PANEL
Cholesterol: 142 mg/dL (ref <200)
HDL: 47 mg/dL (ref >=40)
LDL Cholesterol (Calc): 81 mg/dL (calc)
Non-HDL Cholesterol (Calc): 95 mg/dL (calc) (ref <130)
Total CHOL/HDL Ratio: 3 (calc) (ref <5.0)
Triglycerides: 59 mg/dL (ref <150)

## 2020-04-26 LAB — HIV-1 RNA QUANT-NO REFLEX-BLD
HIV 1 RNA Quant: NOT DETECTED Copies/mL
HIV-1 RNA Quant, Log: NOT DETECTED Log cps/mL

## 2020-04-26 LAB — RPR: RPR Ser Ql: NONREACTIVE

## 2020-05-07 ENCOUNTER — Encounter: Payer: Self-pay | Admitting: Infectious Disease

## 2020-05-07 ENCOUNTER — Ambulatory Visit (INDEPENDENT_AMBULATORY_CARE_PROVIDER_SITE_OTHER): Payer: Self-pay | Admitting: Infectious Disease

## 2020-05-07 ENCOUNTER — Other Ambulatory Visit: Payer: Self-pay

## 2020-05-07 VITALS — BP 136/77 | HR 78 | Temp 98.5°F | Ht 71.0 in | Wt 183.0 lb

## 2020-05-07 DIAGNOSIS — N179 Acute kidney failure, unspecified: Secondary | ICD-10-CM

## 2020-05-07 DIAGNOSIS — B20 Human immunodeficiency virus [HIV] disease: Secondary | ICD-10-CM

## 2020-05-07 DIAGNOSIS — F17200 Nicotine dependence, unspecified, uncomplicated: Secondary | ICD-10-CM

## 2020-05-07 NOTE — Progress Notes (Signed)
Chief complaint: Follow up for HIV on medications   Subjective:    Patient ID: Darrell [name], male    DOB: 1991/02/01, 30 y.o.   MRN: [id number]  HPI  30 year old [place] American man with HIV, who tested positive in July 2015 after negative test in June of 2015. He has HIV + partner. His VL at intake was in the 300s and CD4 above 400.  No resistance data back. HLA b701 negative.Since then he has fully suppress his virus on TRIUMEQ --> DOVATO. He remains interested in [place]    Past Medical History:  Diagnosis Date  . AKI (acute kidney injury) ([place]) 07/10/2016  . HIV infection ([place])   . HTN (hypertension) 09/05/2018  . Screen for STD (sexually transmitted disease) 09/09/2014  . Smoker 11/23/2014    No past surgical history on file.  Family History  Problem Relation Age of Onset  . Cancer Mother   . Fibromyalgia Mother   . Hypertension Mother       Social History   Socioeconomic History  . Marital status: Single    Spouse name: Not on file  . Number of children: Not on file  . Years of education: Not on file  . Highest education level: Not on file  Occupational History  . Not on file  Tobacco Use  . Smoking status: Light Tobacco Smoker    Last attempt to quit: 06/21/2015    Years since quitting: 4.8  . Smokeless tobacco: Never Used  . Tobacco comment: socially  Substance and Sexual Activity  . Alcohol use: Yes    Alcohol/week: 0.0 standard drinks    Comment: social  . Drug use: Yes    Frequency: 7.0 times per week    Types: Marijuana    Comment: daily  . Sexual activity: Yes    Partners: Male    Birth control/protection: Condom    Comment: pt declined condoms 11/03/19  Other Topics Concern  . Not on file  Social History Narrative  . Not on file   Social Determinants of Health   Financial Resource Strain: Not on file  Food Insecurity: Not on file  Transportation Needs: Not on file  Physical Activity: Not on file  Stress: Not on file  Social  Connections: Not on file      Review of Systems  Constitutional: Negative for activity change, appetite change, chills, diaphoresis, fatigue, fever and unexpected weight change.  HENT: Negative for congestion, rhinorrhea, sinus pressure, sneezing, sore throat and trouble swallowing.   Eyes: Negative for photophobia and visual disturbance.  Respiratory: Negative for cough, chest tightness, shortness of breath, wheezing and stridor.   Cardiovascular: Negative for chest pain, palpitations and leg swelling.  Gastrointestinal: Negative for abdominal distention, abdominal pain, anal bleeding, blood in stool, constipation, diarrhea, nausea and vomiting.  Genitourinary: Negative for difficulty urinating, dysuria, flank pain and hematuria.  Musculoskeletal: Negative for arthralgias, back pain, gait problem and myalgias.  Skin: Negative for color change, pallor, rash and wound.  Neurological: Negative for dizziness, tremors, weakness and light-headedness.  Hematological: Negative for adenopathy. Does not bruise/bleed easily.  Psychiatric/Behavioral: Negative for agitation, behavioral problems, confusion, decreased concentration, dysphoric mood, self-injury and sleep disturbance.           Objective:   Physical Exam Constitutional:      Appearance: He is well-developed.  HENT:     Head: Normocephalic and atraumatic.  Eyes:     Conjunctiva/sclera: Conjunctivae normal.  Neck:     Vascular:  No JVD.  Cardiovascular:     Rate and Rhythm: Normal rate and regular rhythm.  Pulmonary:     Effort: Pulmonary effort is normal. No respiratory distress.     Breath sounds: No wheezing.  Abdominal:     General: There is no distension.     Palpations: Abdomen is soft.  Musculoskeletal:        General: No tenderness. Normal range of motion.     Cervical back: Normal range of motion and neck supple.  Skin:    General: Skin is warm and dry.     Coloration: Skin is not pale.     Findings: No erythema  or rash.  Neurological:     Mental Status: He is alert and oriented to person, place, and time.  Psychiatric:        Mood and Affect: Mood normal.        Behavior: Behavior normal.        Thought Content: Thought content normal.        Judgment: Judgment normal.         Assessment & Plan:   HIV: Continue with DOVATO and he is interested in [facility]  He would be a great candidate in particular because he never had much viremia OFF meds  I will send message to  Butch Penny  . Creatinine up: has been up before. But stable  Smoker: encourage cessation

## 2020-05-17 ENCOUNTER — Encounter: Payer: Self-pay | Admitting: Infectious Disease

## 2020-06-28 ENCOUNTER — Telehealth: Payer: Self-pay

## 2020-06-28 DIAGNOSIS — B20 Human immunodeficiency virus [HIV] disease: Secondary | ICD-10-CM

## 2020-06-28 MED ORDER — DOVATO 50-300 MG PO TABS: 1.0000 | ORAL_TABLET | Freq: Every day | ORAL | 2 refills | Status: DC

## 2020-06-28 NOTE — Telephone Encounter (Signed)
Patient called to request Dovato refill. Sent to [facility] on [address].   Sandie Ano, RN

## 2020-09-15 ENCOUNTER — Other Ambulatory Visit: Payer: Self-pay

## 2020-09-15 DIAGNOSIS — B20 Human immunodeficiency virus [HIV] disease: Secondary | ICD-10-CM

## 2020-09-15 DIAGNOSIS — Z113 Encounter for screening for infections with a predominantly sexual mode of transmission: Secondary | ICD-10-CM

## 2020-09-15 DIAGNOSIS — Z79899 Other long term (current) drug therapy: Secondary | ICD-10-CM

## 2020-09-22 ENCOUNTER — Other Ambulatory Visit: Payer: Self-pay

## 2020-10-06 ENCOUNTER — Encounter: Payer: Self-pay | Admitting: Infectious Disease

## 2020-12-10 ENCOUNTER — Telehealth: Payer: Self-pay

## 2020-12-10 ENCOUNTER — Other Ambulatory Visit: Payer: Self-pay | Admitting: Pharmacist

## 2020-12-10 DIAGNOSIS — B20 Human immunodeficiency virus [HIV] disease: Secondary | ICD-10-CM

## 2020-12-10 NOTE — Telephone Encounter (Signed)
Called patient to get scheduled for a follow up, number on file belongs to someone else.

## 2020-12-15 ENCOUNTER — Telehealth: Payer: Self-pay

## 2020-12-15 NOTE — Telephone Encounter (Signed)
Thank you :)

## 2020-12-15 NOTE — Telephone Encounter (Signed)
Patient called, says he was unable to get his medication from the pharmacy and was told his insurance expired 11/19/20.   He has missed multiple appointments with Dr. Daiva Eves and has not renewed ADAP. Reports he has three pills left. He accepts appointment with pharmacy tomorrow and will need to renew financial.   Sandie Ano, RN

## 2020-12-16 ENCOUNTER — Ambulatory Visit: Payer: Self-pay | Admitting: Pharmacist

## 2020-12-16 ENCOUNTER — Ambulatory Visit (INDEPENDENT_AMBULATORY_CARE_PROVIDER_SITE_OTHER): Payer: Self-pay | Admitting: Pharmacist

## 2020-12-16 ENCOUNTER — Ambulatory Visit: Payer: Self-pay

## 2020-12-16 ENCOUNTER — Other Ambulatory Visit: Payer: Self-pay

## 2020-12-16 DIAGNOSIS — B20 Human immunodeficiency virus [HIV] disease: Secondary | ICD-10-CM

## 2020-12-16 DIAGNOSIS — Z23 Encounter for immunization: Secondary | ICD-10-CM

## 2020-12-16 MED ORDER — DOVATO 50-300 MG PO TABS: 1.0000 | ORAL_TABLET | Freq: Every day | ORAL | 4 refills | Status: DC

## 2020-12-16 NOTE — Progress Notes (Signed)
HPI: Darrell [name] is a 30 y.o. male who presents to the [facility] for HIV follow-up.  Patient Active Problem List   Diagnosis Date Noted   HTN (hypertension) 09/05/2018   AKI (acute kidney injury) ([place]) 07/10/2016   CRI (chronic renal insufficiency), stage 1 01/11/2016   Smoker 11/23/2014   Screen for STD (sexually transmitted disease) 09/09/2014   Elevated blood pressure 05/20/2014   HIV disease ([place]) 10/08/2013   HEADACHE, TENSION 11/09/2006   RHINITIS, ALLERGIC NEC 11/09/2006    Patient's Medications  New Prescriptions   DOLUTEGRAVIR-LAMIVUDINE (DOVATO) 50-300 MG TABLET    Take 1 tablet by mouth daily.  Previous Medications   MULTIPLE VITAMIN (MULTI-VITAMINS) TABS    Take by mouth.   OMEGA-3 FATTY ACIDS (FISH OIL PO)    Take by mouth.  Modified Medications   No medications on file  Discontinued Medications   DOVATO 50-300 MG TABLET    TAKE 1 TABLET BY MOUTH DAILY    Allergies: Allergies  Allergen Reactions   Sulfonamide Derivatives     unknown    Past Medical History: Past Medical History:  Diagnosis Date   AKI (acute kidney injury) ([place]) 07/10/2016   HIV infection ([place])    HTN (hypertension) 09/05/2018   Screen for STD (sexually transmitted disease) 09/09/2014   Smoker 11/23/2014    Social History: Social History   Socioeconomic History   Marital status: Single    Spouse name: Not on file   Number of children: Not on file   Years of education: Not on file   Highest education level: Not on file  Occupational History   Not on file  Tobacco Use   Smoking status: Light Smoker    Types: Cigarettes    Last attempt to quit: 06/21/2015    Years since quitting: 5.4   Smokeless tobacco: Never   Tobacco comments:    socially  Substance and Sexual Activity   Alcohol use: Yes    Alcohol/week: 0.0 standard drinks    Comment: social   Drug use: Yes    Frequency: 7.0 times per week    Types: Marijuana    Comment: daily   Sexual activity: Yes     Partners: Male    Birth control/protection: Condom    Comment: pt declined condoms 11/03/19  Other Topics Concern   Not on file  Social History Narrative   Not on file   Social Determinants of Health   Financial Resource Strain: Not on file  Food Insecurity: Not on file  Transportation Needs: Not on file  Physical Activity: Not on file  Stress: Not on file  Social Connections: Not on file    Labs: Lab Results  Component Value Date   HIV1RNAQUANT Not Detected 04/23/2020   HIV1RNAQUANT 23 (H) 10/23/2019   HIV1RNAQUANT <20 NOT DETECTED 04/01/2019   CD4TABS 738 10/23/2019   CD4TABS 598 04/01/2019   CD4TABS 839 08/22/2018    RPR and STI Lab Results  Component Value Date   LABRPR NON-REACTIVE 04/23/2020   LABRPR NON-REACTIVE 10/23/2019   LABRPR NON-REACTIVE 04/01/2019   LABRPR NON-REACTIVE 08/22/2018   LABRPR NON-REACTIVE 06/27/2017   RPRTITER 1:16 (A) 06/07/2015    STI Results GC CT  08/22/2018 Negative Negative  06/27/2017 Negative Negative  07/03/2016 Negative Negative  03/21/2016 Negative Negative  12/30/2015 Negative Negative  06/07/2015 Negative Negative  09/09/2014 *Negative *Negative  09/09/2014 Negative Negative  01/21/2014 NG: Negative CT: Negative    Hepatitis B Lab Results  Component Value  Date   HEPBSAB POS (A) 09/23/2013   HEPBSAG NEGATIVE 09/23/2013   HEPBCAB NON REACTIVE 09/23/2013   Hepatitis C No results found for: HEPCAB, HCVRNAPCRQN Hepatitis A Lab Results  Component Value Date   HAV NON REACTIVE 09/23/2013   Lipids: Lab Results  Component Value Date   CHOL 142 04/23/2020   TRIG 59 04/23/2020   HDL 47 04/23/2020   CHOLHDL 3.0 04/23/2020   VLDL 19 07/03/2016   LDLCALC 81 04/23/2020    Current HIV Regimen: Dovato  Assessment: Darrell [name] presents to clinic today for HIV follow-up. He last saw Dr. Tommy Medal in March this year and has been undetectable/maintained on Dovato. No new side effects, and patient states he misses 3-4 pills per month. No  problems with pharmacy; would like to continue having medications delivered from [facility]. Currently has two pills left. Met with Juliann Pulse to complete ADAP forms today and sent refills to [facility] in [place] pending ADAP renewal. Will provide patient with 14 days of Dovato samples to cover him until ADAP renewal is approved.  Patient has been undetectable with a CD4 1796 in March. Will check HIV RNA, CD4, RPR, and urine/oral/rectal cytologies today. He will follow-up with Dr. Tommy Medal in 3 months. Reviewed with him to call and reschedule appointments if unable to make scheduled ones so they are not listed as no shows. He verbalized understanding. 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 He states he is still fairly interested in [place] but wants to wait until he decides if he will move or not before starting. He also states one barrier to the injection is how often he would have to come to clinic versus every few months.  Patient due for annual flu shot and updated COVID booster. Both administered in clinic today.   Patient states he also remains with his one partner who is HIV-negative. Encouraged him to discuss PrEP with his partner. Explained the U = U concept but that an additional protection is beneficial. Reviewed that uninsured patients are welcome here as we have a grant that can cover all office/lab fees. He stated he would discuss with his partner. Gave him our card and told him to call if he has any questions.  Plan: Provide Dovato samples Prescribe Dovato x 4 refills to [facility] Check HIV RNA, CD4, RPR, and urine/oral/rectal cytologies Administer COVID bivalent booster and flu shot  Follow-up with Dr. Tommy Medal on 2/13 at 3:30pm  Alfonse Spruce,  PharmD, CPP Clinical Pharmacist Practitioner Infectious Diseases [facility] 12/16/2020, 12:17 PM

## 2020-12-17 LAB — URINE CYTOLOGY ANCILLARY ONLY
Chlamydia: NEGATIVE
Comment: NEGATIVE
Comment: NORMAL
Neisseria Gonorrhea: NEGATIVE

## 2020-12-17 LAB — T-HELPER CELLS (CD4) COUNT (NOT AT ARMC)
CD4 % Helper T Cell: 37 % (ref 33–65)
CD4 T Cell Abs: 771 /uL (ref 400–1790)

## 2020-12-17 NOTE — Addendum Note (Signed)
Addended by: Harley Alto on: 12/17/2020 11:15 AM   Modules accepted: Orders

## 2020-12-19 LAB — RPR: RPR Ser Ql: NONREACTIVE

## 2020-12-19 LAB — HIV-1 RNA QUANT-NO REFLEX-BLD
HIV 1 RNA Quant: NOT DETECTED Copies/mL
HIV-1 RNA Quant, Log: NOT DETECTED Log cps/mL

## 2020-12-20 LAB — CYTOLOGY, (ORAL, ANAL, URETHRAL) ANCILLARY ONLY
Chlamydia: NEGATIVE
Chlamydia: NEGATIVE
Comment: NEGATIVE
Comment: NEGATIVE
Comment: NORMAL
Comment: NORMAL
Neisseria Gonorrhea: NEGATIVE
Neisseria Gonorrhea: NEGATIVE

## 2020-12-29 ENCOUNTER — Telehealth: Payer: Self-pay

## 2020-12-29 NOTE — Telephone Encounter (Signed)
Patient in the process of renewing ADAP. Says he can come in tomorrow to pick up samples of Dovato.   Sandie Ano, RN

## 2020-12-29 NOTE — Telephone Encounter (Signed)
Happy to provide him with another 14-day sample. I am surprised his ADAP is not approved already given he completed paperwork on 10/27.

## 2021-01-12 ENCOUNTER — Encounter: Payer: Self-pay | Admitting: Infectious Disease

## 2021-01-18 ENCOUNTER — Other Ambulatory Visit: Payer: Self-pay | Admitting: Pharmacist

## 2021-01-18 DIAGNOSIS — B20 Human immunodeficiency virus [HIV] disease: Secondary | ICD-10-CM

## 2021-01-18 MED ORDER — DOVATO 50-300 MG PO TABS: 1.0000 | ORAL_TABLET | Freq: Every day | ORAL | 0 refills | Status: DC

## 2021-01-18 NOTE — Progress Notes (Signed)
Medication Samples have been provided to the patient. ° °Drug name: Dovato        °Strength: 50/300 mg         °Qty: 28  Tablets (2 bottles) °LOT: 2A6H   °Exp.Date: 2/24 ° °Dosing instructions: Take one tablet by mouth once daily ° °The patient has been instructed regarding the correct time, dose, and frequency of taking this medication, including desired effects and most common side effects.  ° °Jozelynn Danielson, PharmD, CPP °Clinical Pharmacist Practitioner °Infectious Diseases Clinical Pharmacist °[facility] ° °

## 2021-03-18 ENCOUNTER — Telehealth: Payer: Self-pay | Admitting: Physician Assistant

## 2021-03-18 DIAGNOSIS — A084 Viral intestinal infection, unspecified: Secondary | ICD-10-CM

## 2021-03-18 MED ORDER — ONDANSETRON HCL 4 MG PO TABS: 4.0000 mg | ORAL_TABLET | Freq: Three times a day (TID) | ORAL | 0 refills | Status: DC | PRN

## 2021-03-18 NOTE — Patient Instructions (Signed)
Darrell [name], thank you for joining [name], PA-C for today's virtual visit.  While this provider is not your primary care provider (PCP), if your PCP is located in our provider database this encounter information will be shared with them immediately following your visit.  Consent: (Patient) Darrell [name] provided verbal consent for this virtual visit at the beginning of the encounter.  Current Medications:  Current Outpatient Medications:    ondansetron (ZOFRAN) 4 MG tablet, Take 1 tablet (4 mg total) by mouth every 8 (eight) hours as needed for nausea or vomiting., Disp: 20 tablet, Rfl: 0   dolutegravir-lamiVUDine (DOVATO) 50-300 MG tablet, Take 1 tablet by mouth daily., Disp: 30 tablet, Rfl: 4   dolutegravir-lamiVUDine (DOVATO) 50-300 MG tablet, Take 1 tablet by mouth daily for 28 days., Disp: 28 tablet, Rfl: 0   Multiple Vitamin (MULTI-VITAMINS) TABS, Take by mouth., Disp: , Rfl:    Omega-3 Fatty Acids (FISH OIL PO), Take by mouth., Disp: , Rfl:    Medications ordered in this encounter:  Meds ordered this encounter  Medications   ondansetron (ZOFRAN) 4 MG tablet    Sig: Take 1 tablet (4 mg total) by mouth every 8 (eight) hours as needed for nausea or vomiting.    Dispense:  20 tablet    Refill:  0    Order Specific Question:   Supervising Provider    Answer:   Hyacinth Meeker, BRIAN [[id number]]     *If you need refills on other medications prior to your next appointment, please contact your pharmacy*  Follow-Up: Call back or seek an in-person evaluation if the symptoms worsen or if the condition fails to improve as anticipated.  Other Instructions Viral Gastroenteritis, Adult Viral gastroenteritis is also known as the stomach flu. This condition may affect your stomach, small intestine, and large intestine. It can cause sudden watery diarrhea, fever, and vomiting. This condition is caused by many different viruses. These viruses can be passed from person to person very  easily (are contagious). Diarrhea and vomiting can make you feel weak and cause you to become dehydrated. You may not be able to keep fluids down. Dehydration can make you tired and thirsty, cause you to have a dry mouth, and decrease how often you urinate. It is important to replace the fluids that you lose from diarrhea and vomiting. What are the causes? Gastroenteritis is caused by many viruses, including rotavirus and norovirus. Norovirus is the most common cause in adults. You can get sick after being exposed to the viruses from other people. You can also get sick by: Eating food, drinking water, or touching a surface contaminated with one of these viruses. Sharing utensils or other personal items with an infected person. What increases the risk? You are more likely to develop this condition if you: Have a weak body defense system (immune system). Live with one or more children who are younger than 70 years old. Live in a nursing home. Travel on cruise ships. What are the signs or symptoms? Symptoms of this condition start suddenly 1-3 days after exposure to a virus. Symptoms may last for a few days or for as long as a week. Common symptoms include watery diarrhea and vomiting. Other symptoms include: Fever. Headache. Fatigue. Pain in the abdomen. Chills. Weakness. Nausea. Muscle aches. Loss of appetite. How is this diagnosed? This condition is diagnosed with a medical history and physical exam. You may also have a stool test to check for viruses or other infections. How is  this treated? This condition typically goes away on its own. The focus of treatment is to prevent dehydration and restore lost fluids (rehydration). This condition may be treated with: An oral rehydration solution (ORS) to replace important salts and minerals (electrolytes) in your body. Take this if told by your health care provider. This is a drink that is sold at pharmacies and retail stores. Medicines to help  with your symptoms. Probiotic supplements to reduce symptoms of diarrhea. Fluids given through an IV, if dehydration is severe. Older adults and people with other diseases or a weak immune system are at higher risk for dehydration. Follow these instructions at home: Eating and drinking  Take an ORS as told by your health care provider. Drink clear fluids in small amounts as you are able. Clear fluids include: Water. Ice chips. Diluted fruit juice. Low-calorie sports drinks. Drink enough fluid to keep your urine pale yellow. Eat small amounts of healthy foods every 3-4 hours as you are able. This may include whole grains, fruits, vegetables, lean meats, and yogurt. Avoid fluids that contain a lot of sugar or caffeine, such as energy drinks, sports drinks, and soda. Avoid spicy or fatty foods. Avoid alcohol. General instructions Wash your hands often, especially after having diarrhea or vomiting. If soap and water are not available, use hand sanitizer. Make sure that all people in your household wash their hands well and often. Take over-the-counter and prescription medicines only as told by your health care provider. Rest at home while you recover. Watch your condition for any changes. Take a warm bath to relieve any burning or pain from frequent diarrhea episodes. Keep all follow-up visits as told by your health care provider. This is important. Contact a health care provider if you: Cannot keep fluids down. Have symptoms that get worse. Have new symptoms. Feel light-headed or dizzy. Have muscle cramps. Get help right away if you: Have chest pain. Feel extremely weak or you faint. See blood in your vomit. Have vomit that looks like coffee grounds. Have bloody or black stools or stools that look like tar. Have a severe headache, a stiff neck, or both. Have a rash. Have severe pain, cramping, or bloating in your abdomen. Have trouble breathing or you are breathing very  quickly. Have a fast heartbeat. Have skin that feels cold and clammy. Feel confused. Have pain when you urinate. Have signs of dehydration, such as: Dark urine, very little urine, or no urine. Cracked lips. Dry mouth. Sunken eyes. Sleepiness. Weakness. Summary Viral gastroenteritis is also known as the stomach flu. It can cause sudden watery diarrhea, fever, and vomiting. This condition can be passed from person to person very easily (is contagious). Take an ORS if told by your health care provider. This is a drink that is sold at pharmacies and retail stores. Wash your hands often, especially after having diarrhea or vomiting. If soap and water are not available, use hand sanitizer. This information is not intended to replace advice given to you by your health care provider. Make sure you discuss any questions you have with your health care provider. Document Revised: 07/26/2018 Document Reviewed: 12/12/2017 Elsevier Patient Education  2022 [employer]    If you have been instructed to have an in-person evaluation today at a local Urgent Care facility, please use the link below. It will take you to a list of all of our available [facility], including address, phone number and hours of operation. Please do not delay care.  [facility]  If you or a family member do not have a primary care provider, use the link below to schedule a visit and establish care. When you choose a [facility] primary care physician or advanced practice provider, you gain a long-term partner in health. Find a Primary Care Provider  Learn more about [facility] in-office and virtual care options: [facility] - Get Care Now

## 2021-03-18 NOTE — Progress Notes (Signed)
Virtual Visit Consent   [name], you are scheduled for a virtual visit with a [facility] provider today.     Just as with appointments in the office, your consent must be obtained to participate.  Your consent will be active for this visit and any virtual visit you may have with one of our providers in the next 365 days.     If you have a MyChart account, a copy of this consent can be sent to you electronically.  All virtual visits are billed to your insurance company just like a traditional visit in the office.    As this is a virtual visit, video technology does not allow for your provider to perform a traditional examination.  This may limit your provider's ability to fully assess your condition.  If your provider identifies any concerns that need to be evaluated in person or the need to arrange testing (such as labs, EKG, etc.), we will make arrangements to do so.     Although advances in technology are sophisticated, we cannot ensure that it will always work on either your end or our end.  If the connection with a video visit is poor, the visit may have to be switched to a telephone visit.  With either a video or telephone visit, we are not always able to ensure that we have a secure connection.     I need to obtain your verbal consent now.   Are you willing to proceed with your visit today?    Darrell [name] has provided verbal consent on 03/18/2021 for a virtual visit (video or telephone).   Mar Daring, PA-C   Date: 03/18/2021 12:53 PM   Virtual Visit via Video Note   I, Mar Daring, connected with  Darrell [name]  ([id number], 1990/12/23) on 03/18/21 at 12:45 PM EST by a video-enabled telemedicine application and verified that I am speaking with the correct person using two identifiers.  Location: Patient: Virtual Visit Location Patient: Home Provider: Virtual Visit Location Provider: Home Office   I discussed the limitations of evaluation and  management by telemedicine and the availability of in person appointments. The patient expressed understanding and agreed to proceed.    History of Present Illness: Darrell [name] is a 31 y.o. who identifies as a male who was assigned male at birth, and is being seen today for nausea, diarrhea, vomiting.  HPI: Emesis  This is a new problem. The current episode started yesterday. The problem has been unchanged. The emesis has an appearance of stomach contents. Associated symptoms include abdominal pain, chills, diarrhea, a fever (possaible subjective fevers), myalgias and sweats (mild). Risk factors include ill contacts. He has tried increased fluids and bed rest (ibuprofen 600mg ) for the symptoms. The treatment provided mild relief.     Problems:  Patient Active Problem List   Diagnosis Date Noted   HTN (hypertension) 09/05/2018   AKI (acute kidney injury) ([facility]) 07/10/2016   CRI (chronic renal insufficiency), stage 1 01/11/2016   Smoker 11/23/2014   Screen for STD (sexually transmitted disease) 09/09/2014   Elevated blood pressure 05/20/2014   HIV disease ([facility]) 10/08/2013   HEADACHE, TENSION 11/09/2006   RHINITIS, ALLERGIC NEC 11/09/2006    Allergies:  Allergies  Allergen Reactions   Sulfonamide Derivatives     unknown   Medications:  Current Outpatient Medications:    ondansetron (ZOFRAN) 4 MG tablet, Take 1 tablet (4 mg total) by mouth every 8 (eight) hours as needed  for nausea or vomiting., Disp: 20 tablet, Rfl: 0   dolutegravir-lamiVUDine (DOVATO) 50-300 MG tablet, Take 1 tablet by mouth daily., Disp: 30 tablet, Rfl: 4   dolutegravir-lamiVUDine (DOVATO) 50-300 MG tablet, Take 1 tablet by mouth daily for 28 days., Disp: 28 tablet, Rfl: 0   Multiple Vitamin (MULTI-VITAMINS) TABS, Take by mouth., Disp: , Rfl:    Omega-3 Fatty Acids (FISH OIL PO), Take by mouth., Disp: , Rfl:   Observations/Objective: Patient is well-developed, well-nourished in no acute distress.  Resting  comfortably at home.  Head is normocephalic, atraumatic.  No labored breathing.  Speech is clear and coherent with logical content.  Patient is alert and oriented at baseline.    Assessment and Plan: 1. Viral gastroenteritis - ondansetron (ZOFRAN) 4 MG tablet; Take 1 tablet (4 mg total) by mouth every 8 (eight) hours as needed for nausea or vomiting.  Dispense: 20 tablet; Refill: 0  - Suspect Viral gastroenteritis (going around office) - Zofran for nausea/vomiting - Imodium for diarrhea - Push fluids/electrolyte drinks - [place] diet and increase as tolerated over next 2-3 days - Seek in person evaluation if worsening or fails to improve  Follow Up Instructions: I discussed the assessment and treatment plan with the patient. The patient was provided an opportunity to ask questions and all were answered. The patient agreed with the plan and demonstrated an understanding of the instructions.  A copy of instructions were sent to the patient via MyChart unless otherwise noted below.   The patient was advised to call back or seek an in-person evaluation if the symptoms worsen or if the condition fails to improve as anticipated.  Time:  I spent 10 minutes with the patient via telehealth technology discussing the above problems/concerns.    Mar Daring, PA-C

## 2021-03-28 ENCOUNTER — Other Ambulatory Visit (HOSPITAL_COMMUNITY): Payer: Self-pay

## 2021-04-04 ENCOUNTER — Encounter: Payer: Self-pay | Admitting: Infectious Disease

## 2021-04-04 ENCOUNTER — Ambulatory Visit: Payer: Self-pay

## 2021-04-04 ENCOUNTER — Other Ambulatory Visit: Payer: Self-pay

## 2021-04-04 ENCOUNTER — Ambulatory Visit (INDEPENDENT_AMBULATORY_CARE_PROVIDER_SITE_OTHER): Payer: Self-pay | Admitting: Infectious Disease

## 2021-04-04 ENCOUNTER — Ambulatory Visit (INDEPENDENT_AMBULATORY_CARE_PROVIDER_SITE_OTHER): Payer: Self-pay

## 2021-04-04 VITALS — BP 130/80 | HR 74 | Temp 98.1°F | Wt 170.0 lb

## 2021-04-04 DIAGNOSIS — I10 Essential (primary) hypertension: Secondary | ICD-10-CM

## 2021-04-04 DIAGNOSIS — Z113 Encounter for screening for infections with a predominantly sexual mode of transmission: Secondary | ICD-10-CM

## 2021-04-04 DIAGNOSIS — F17200 Nicotine dependence, unspecified, uncomplicated: Secondary | ICD-10-CM

## 2021-04-04 DIAGNOSIS — B20 Human immunodeficiency virus [HIV] disease: Secondary | ICD-10-CM

## 2021-04-04 DIAGNOSIS — Z23 Encounter for immunization: Secondary | ICD-10-CM

## 2021-04-04 MED ORDER — DOVATO 50-300 MG PO TABS: 1.0000 | ORAL_TABLET | Freq: Every day | ORAL | 11 refills | Status: DC

## 2021-04-04 NOTE — Progress Notes (Signed)
Chief complaint: Follow-up for HIV disease on medications.  Subjective:    Patient ID: Darrell [name], male    DOB: 08/14/1990, 31 y.o.   MRN: [id number]  HPI  31 year old [place] American man with HIV, who tested positive in July 2015 after negative test in June of 2015. He has HIV + partner. His VL at intake was in the 300s and CD4 above 400.  No resistance data back. HLA b701 negative.Since then he has fully suppress his virus on TRIUMEQ --> DOVATO.   He had been interested in [place] but is considering moving to [place] versus [place] and does not want to be stuck needing to come to clinic every other month.    Past Medical History:  Diagnosis Date   AKI (acute kidney injury) ([facility]) 07/10/2016   HIV infection ([facility])    HTN (hypertension) 09/05/2018   Screen for STD (sexually transmitted disease) 09/09/2014   Smoker 11/23/2014    No past surgical history on file.  Family History  Problem Relation Age of Onset   Cancer Mother    Fibromyalgia Mother    Hypertension Mother       Social History   Socioeconomic History   Marital status: Single    Spouse name: Not on file   Number of children: Not on file   Years of education: Not on file   Highest education level: Not on file  Occupational History   Not on file  Tobacco Use   Smoking status: Light Smoker    Types: Cigarettes    Last attempt to quit: 06/21/2015    Years since quitting: 5.7   Smokeless tobacco: Never   Tobacco comments:    socially  Substance and Sexual Activity   Alcohol use: Yes    Alcohol/week: 0.0 standard drinks    Comment: social   Drug use: Yes    Frequency: 7.0 times per week    Types: Marijuana    Comment: daily   Sexual activity: Yes    Partners: Male    Birth control/protection: Condom    Comment: pt declined condoms 11/03/19  Other Topics Concern   Not on file  Social History Narrative   Not on file   Social Determinants of Health   Financial Resource Strain: Not on file   Food Insecurity: Not on file  Transportation Needs: Not on file  Physical Activity: Not on file  Stress: Not on file  Social Connections: Not on file      Review of Systems  Constitutional:  Negative for activity change, appetite change, chills, diaphoresis, fatigue, fever and unexpected weight change.  HENT:  Negative for congestion, rhinorrhea, sinus pressure, sneezing, sore throat and trouble swallowing.   Eyes:  Negative for photophobia and visual disturbance.  Respiratory:  Negative for cough, chest tightness, shortness of breath, wheezing and stridor.   Cardiovascular:  Negative for chest pain, palpitations and leg swelling.  Gastrointestinal:  Negative for abdominal distention, abdominal pain, anal bleeding, blood in stool, constipation, diarrhea, nausea and vomiting.  Genitourinary:  Negative for difficulty urinating, dysuria, flank pain and hematuria.  Musculoskeletal:  Negative for arthralgias, back pain, gait problem, joint swelling and myalgias.  Skin:  Negative for color change, pallor, rash and wound.  Neurological:  Negative for dizziness, tremors, weakness and light-headedness.  Hematological:  Negative for adenopathy. Does not bruise/bleed easily.  Psychiatric/Behavioral:  Negative for agitation, behavioral problems, confusion, decreased concentration, dysphoric mood and sleep disturbance.  Objective:   Physical Exam Constitutional:      Appearance: He is well-developed.  HENT:     Head: Normocephalic and atraumatic.  Eyes:     Conjunctiva/sclera: Conjunctivae normal.  Cardiovascular:     Rate and Rhythm: Normal rate and regular rhythm.  Pulmonary:     Effort: Pulmonary effort is normal. No respiratory distress.     Breath sounds: No wheezing.  Abdominal:     General: There is no distension.     Palpations: Abdomen is soft.  Musculoskeletal:        General: No tenderness. Normal range of motion.     Cervical back: Normal range of motion and  neck supple.  Skin:    General: Skin is warm and dry.     Coloration: Skin is not pale.     Findings: No erythema or rash.  Neurological:     General: No focal deficit present.     Mental Status: He is alert and oriented to person, place, and time.  Psychiatric:        Mood and Affect: Mood normal.        Behavior: Behavior normal.        Thought Content: Thought content normal.        Judgment: Judgment normal.        Assessment & Plan:   HIV disease:  Checking a viral load with CD4 count CBC CMP  I am continue his Dovato prescription.  If he indeed moved to [place] or [place] he should moved to a [facility] clinic so it is no disccontinuation of the continuity of care.  He is renewing HM AP and [place] right now  . Smoking encourage cessation  Vaccine counseling wanted to make sure he was up-to-date on updated COVID-19 booster Prevnar 20 and annual flu shot.

## 2021-04-30 ENCOUNTER — Telehealth: Payer: Self-pay | Admitting: Physician Assistant

## 2021-04-30 DIAGNOSIS — R197 Diarrhea, unspecified: Secondary | ICD-10-CM

## 2021-04-30 DIAGNOSIS — R110 Nausea: Secondary | ICD-10-CM

## 2021-04-30 MED ORDER — ONDANSETRON HCL 4 MG PO TABS: 4.0000 mg | ORAL_TABLET | Freq: Three times a day (TID) | ORAL | 0 refills | Status: DC | PRN

## 2021-04-30 NOTE — Progress Notes (Signed)
?Virtual Visit Consent  ? ?Darrell [name], you are scheduled for a virtual visit with a [facility] provider today.   ?  ?Just as with appointments in the office, your consent must be obtained to participate.  Your consent will be active for this visit and any virtual visit you may have with one of our providers in the next 365 days.   ?  ?If you have a MyChart account, a copy of this consent can be sent to you electronically.  All virtual visits are billed to your insurance company just like a traditional visit in the office.   ? ?As this is a virtual visit, video technology does not allow for your provider to perform a traditional examination.  This may limit your provider's ability to fully assess your condition.  If your provider identifies any concerns that need to be evaluated in person or the need to arrange testing (such as labs, EKG, etc.), we will make arrangements to do so.   ?  ?Although advances in technology are sophisticated, we cannot ensure that it will always work on either your end or our end.  If the connection with a video visit is poor, the visit may have to be switched to a telephone visit.  With either a video or telephone visit, we are not always able to ensure that we have a secure connection.    ? ?I need to obtain your verbal consent now.   Are you willing to proceed with your visit today?  ?  ?KHALIQUE BORG has provided verbal consent on 04/30/2021 for a virtual visit (video or telephone). ?  ?Kennieth Rad, PA-C  ? ?Date: 04/30/2021 12:11 PM ? ? ?Virtual Visit via Video Note  ? ?I, Wilson, connected with  Darrell [name]  ([id number], 03-Apr-1990) on 04/30/21 at 12:00 PM EST by a video-enabled telemedicine application and verified that I am speaking with the correct person using two identifiers. ? ?Location: ?Patient: Virtual Visit Location Patient: Home ?Provider: Virtual Visit Location Provider: Home Office ?  ?I discussed the limitations of evaluation and management by  telemedicine and the availability of in person appointments. The patient expressed understanding and agreed to proceed.   ? ?History of Present Illness: ?Darrell [name] is a 31 y.o. who identifies as a male who was assigned male at birth, states that he started having episodes of watery diarrhea since yesterday.  States that he did have [place] food yesterday is unsure if it was related to that, is unsure if he has had sick contacts at work.  States that he has been feeling weak, tired, experiencing nausea without vomiting.  States that he is staying very well-hydrated, has not been able to eat anything yet today due to the nausea.  Has not tried anything for relief. ? ? ?HPI: HPI  ?Problems:  ?Patient Active Problem List  ? Diagnosis Date Noted  ? HTN (hypertension) 09/05/2018  ? AKI (acute kidney injury) ([facility]) 07/10/2016  ? CRI (chronic renal insufficiency), stage 1 01/11/2016  ? Smoker 11/23/2014  ? Screen for STD (sexually transmitted disease) 09/09/2014  ? Elevated blood pressure 05/20/2014  ? HIV disease ([facility]) 10/08/2013  ? HEADACHE, TENSION 11/09/2006  ? RHINITIS, ALLERGIC NEC 11/09/2006  ?  ?Allergies:  ?Allergies  ?Allergen Reactions  ? Sulfonamide Derivatives   ?  unknown  ? ?Medications:  ?Current Outpatient Medications:  ?  dolutegravir-lamiVUDine (DOVATO) 50-300 MG tablet, Take 1 tablet by mouth daily for 28 days.,  Disp: 28 tablet, Rfl: 0 ?  dolutegravir-lamiVUDine (DOVATO) 50-300 MG tablet, Take 1 tablet by mouth daily., Disp: 30 tablet, Rfl: 11 ?  Multiple Vitamin (MULTI-VITAMINS) TABS, Take by mouth., Disp: , Rfl:  ?  Omega-3 Fatty Acids (FISH OIL PO), Take by mouth., Disp: , Rfl:  ?  ondansetron (ZOFRAN) 4 MG tablet, Take 1 tablet (4 mg total) by mouth every 8 (eight) hours as needed for nausea or vomiting., Disp: 20 tablet, Rfl: 0 ? ?Observations/Objective: ?Patient is well-developed, well-nourished in no acute distress.  ?Resting comfortably  at home.  ?Head is normocephalic, atraumatic.  ?No  labored breathing.  ?Speech is clear and coherent with logical content.  ?Patient is alert and oriented at baseline.  ? ? ?Assessment and Plan: ?1. Diarrhea, unspecified type ? ?2. Nausea ?- ondansetron (ZOFRAN) 4 MG tablet; Take 1 tablet (4 mg total) by mouth every 8 (eight) hours as needed for nausea or vomiting.  Dispense: 20 tablet; Refill: 0 ? ?Patient education given on supportive care, brat diet, advance as tolerated.  Trial Zofran for nausea. ? ?Follow Up Instructions: ?I discussed the assessment and treatment plan with the patient. The patient was provided an opportunity to ask questions and all were answered. The patient agreed with the plan and demonstrated an understanding of the instructions.  A copy of instructions were sent to the patient via MyChart unless otherwise noted below.  ? ?Patient has requested to receive PHI (AVS, Work Notes, etc) pertaining to this video visit through e-mail as they are currently without active [facility]. They have voiced understand that email is not considered secure and their health information could be viewed by someone other than the patient.  ? ?The patient was advised to call back or seek an in-person evaluation if the symptoms worsen or if the condition fails to improve as anticipated. ? ?Time:  ?I spent 12 minutes with the patient via telehealth technology discussing the above problems/concerns.   ? ?Kassy Mcenroe S Mayers, PA-C ? ?

## 2021-04-30 NOTE — Patient Instructions (Signed)
?Darrell [name], thank you for joining Roney Jaffe, PA-C for today's virtual visit.  While this provider is not your primary care provider (PCP), if your PCP is located in our provider database this encounter information will be shared with them immediately following your visit. ? ?Consent: ?(Patient) Darrell [name] provided verbal consent for this virtual visit at the beginning of the encounter. ? ?Current Medications: ? ?Current Outpatient Medications:  ?  dolutegravir-lamiVUDine (DOVATO) 50-300 MG tablet, Take 1 tablet by mouth daily for 28 days., Disp: 28 tablet, Rfl: 0 ?  dolutegravir-lamiVUDine (DOVATO) 50-300 MG tablet, Take 1 tablet by mouth daily., Disp: 30 tablet, Rfl: 11 ?  Multiple Vitamin (MULTI-VITAMINS) TABS, Take by mouth., Disp: , Rfl:  ?  Omega-3 Fatty Acids (FISH OIL PO), Take by mouth., Disp: , Rfl:  ?  ondansetron (ZOFRAN) 4 MG tablet, Take 1 tablet (4 mg total) by mouth every 8 (eight) hours as needed for nausea or vomiting., Disp: 20 tablet, Rfl: 0  ? ?Medications ordered in this encounter:  ?Meds ordered this encounter  ?Medications  ? ondansetron (ZOFRAN) 4 MG tablet  ?  Sig: Take 1 tablet (4 mg total) by mouth every 8 (eight) hours as needed for nausea or vomiting.  ?  Dispense:  20 tablet  ?  Refill:  0  ?  Order Specific Question:   Supervising Provider  ?  Answer:   Eber Hong [[id number]]  ?  ? ?*If you need refills on other medications prior to your next appointment, please contact your pharmacy* ? ?Follow-Up: ?Call back or seek an in-person evaluation if the symptoms worsen or if the condition fails to improve as anticipated. ? ?Other Instructions ?Make sure that you get plenty of rest and are staying very well-hydrated.  I hope that you feel better soon. ? ? ?If you have been instructed to have an in-person evaluation today at a local Urgent Care facility, please use the link below. It will take you to a list of all of our available [facility], including address,  phone number and hours of operation. Please do not delay care.  ?[facility] ? ?If you or a family member do not have a primary care provider, use the link below to schedule a visit and establish care. When you choose a [facility] primary care physician or advanced practice provider, you gain a long-term partner in health. ?Find a Primary Care Provider ? ?Learn more about [facility]'s in-office and virtual care options: ? - Get Care Now ? ? ?Diarrhea, Adult ?Diarrhea is frequent loose and watery bowel movements. Diarrhea can make you feel weak and cause you to become dehydrated. Dehydration can make you tired and thirsty, cause you to have a dry mouth, and decrease how often you urinate. ?Diarrhea typically lasts 2-3 days. However, it can last longer if it is a sign of something more serious. It is important to treat your diarrhea as told by your health care provider. ?Follow these instructions at home: ?Eating and drinking ?  ?Follow these recommendations as told by your health care provider: ?Take an oral rehydration solution (ORS). This is an over-the-counter medicine that helps return your body to its normal balance of nutrients and water. It is found at pharmacies and retail stores. ?Drink plenty of fluids, such as water, ice chips, diluted fruit juice, and low-calorie sports drinks. You can drink milk also, if desired. ?Avoid drinking fluids that contain a lot of sugar or  caffeine, such as energy drinks, sports drinks, and soda. ?Eat bland, easy-to-digest foods in small amounts as you are able. These foods include bananas, applesauce, rice, lean meats, toast, and crackers. ?Avoid alcohol. ?Avoid spicy or fatty foods. ? ?Medicines ?Take over-the-counter and prescription medicines only as told by your health care provider. ?If you were prescribed an antibiotic medicine, take it as told by your health care provider. Do not stop using the antibiotic even if you start to feel  better. ?General instructions ? ?Wash your hands often using soap and water. If soap and water are not available, use a hand sanitizer. Others in the household should wash their hands as well. Hands should be washed: ?After using the toilet or changing a diaper. ?Before preparing, cooking, or serving food. ?While caring for a sick person or while visiting someone in a hospital. ?Drink enough fluid to keep your urine pale yellow. ?Rest at home while you recover. ?Watch your condition for any changes. ?Take a warm bath to relieve any burning or pain from frequent diarrhea episodes. ?Keep all follow-up visits as told by your health care provider. This is important. ?Contact a health care provider if: ?You have a fever. ?Your diarrhea gets worse. ?You have new symptoms. ?You cannot keep fluids down. ?You feel light-headed or dizzy. ?You have a headache. ?You have muscle cramps. ?Get help right away if: ?You have chest pain. ?You feel extremely weak or you faint. ?You have bloody or black stools or stools that look like tar. ?You have severe pain, cramping, or bloating in your abdomen. ?You have trouble breathing or you are breathing very quickly. ?Your heart is beating very quickly. ?Your skin feels cold and clammy. ?You feel confused. ?You have signs of dehydration, such as: ?Dark urine, very little urine, or no urine. ?Cracked lips. ?Dry mouth. ?Sunken eyes. ?Sleepiness. ?Weakness. ?Summary ?Diarrhea is frequent loose and sometimes watery bowel movements. Diarrhea can make you feel weak and cause you to become dehydrated. ?Drink enough fluids to keep your urine pale yellow. ?Make sure that you wash your hands after using the toilet. If soap and water are not available, use hand sanitizer. ?Contact a health care provider if your diarrhea gets worse or you have new symptoms. ?Get help right away if you have signs of dehydration. ?This information is not intended to replace advice given to you by your health care  provider. Make sure you discuss any questions you have with your health care provider. ?Document Revised: 08/18/2020 Document Reviewed: 08/18/2020 ?Elsevier Patient Education ? 2022 Elsevier Inc. ? ?

## 2021-06-11 ENCOUNTER — Telehealth: Payer: Self-pay | Admitting: Nurse Practitioner

## 2021-06-11 DIAGNOSIS — J069 Acute upper respiratory infection, unspecified: Secondary | ICD-10-CM

## 2021-06-11 NOTE — Patient Instructions (Addendum)
?  Darrell [name], thank you for joining Claiborne Rigg, NP for today's virtual visit.  While this provider is not your primary care provider (PCP), if your PCP is located in our provider database this encounter information will be shared with them immediately following your visit. ? ?Consent: ?(Patient) Darrell [name] provided verbal consent for this virtual visit at the beginning of the encounter. ? ?Current Medications: ? ?Current Outpatient Medications:  ?  dolutegravir-lamiVUDine (DOVATO) 50-300 MG tablet, Take 1 tablet by mouth daily for 28 days., Disp: 28 tablet, Rfl: 0 ?  dolutegravir-lamiVUDine (DOVATO) 50-300 MG tablet, Take 1 tablet by mouth daily., Disp: 30 tablet, Rfl: 11 ?  Multiple Vitamin (MULTI-VITAMINS) TABS, Take by mouth., Disp: , Rfl:  ?  Omega-3 Fatty Acids (FISH OIL PO), Take by mouth., Disp: , Rfl:  ?  ondansetron (ZOFRAN) 4 MG tablet, Take 1 tablet (4 mg total) by mouth every 8 (eight) hours as needed for nausea or vomiting., Disp: 20 tablet, Rfl: 0  ? ?Medications ordered in this encounter:  ?No orders of the defined types were placed in this encounter. ?  ? ?*If you need refills on other medications prior to your next appointment, please contact your pharmacy* ? ?Follow-Up: ?Call back or seek an in-person evaluation if the symptoms worsen or if the condition fails to improve as anticipated. ? ?Other Instructions ?INSTRUCTIONS: use a humidifier for nasal congestion ?Drink plenty of fluids, rest and wash hands frequently to avoid the spread of infection ?Alternate tylenol and Motrin for relief of fever  ?Patient instructed that symptoms should be self-limiting ? ?If you have been instructed to have an in-person evaluation today at a local Urgent Care facility, please use the link below. It will take you to a list of all of our available [facility], including address, phone number and hours of operation. Please do not delay care.  ?[facility] ? ?If you or a  family member do not have a primary care provider, use the link below to schedule a visit and establish care. When you choose a [facility] primary care physician or advanced practice provider, you gain a long-term partner in health. ?Find a Primary Care Provider ? ?Learn more about [facility]'s in-office and virtual care options: ?[facility] - Get Care Now  ?

## 2021-06-11 NOTE — Progress Notes (Signed)
?Virtual Visit Consent  ? ?[name], you are scheduled for a virtual visit with a [facility] provider today.   ?  ?Just as with appointments in the office, your consent must be obtained to participate.  Your consent will be active for this visit and any virtual visit you may have with one of our providers in the next 365 days.   ?  ?If you have a MyChart account, a copy of this consent can be sent to you electronically.  All virtual visits are billed to your insurance company just like a traditional visit in the office.   ? ?As this is a virtual visit, video technology does not allow for your provider to perform a traditional examination.  This may limit your provider's ability to fully assess your condition.  If your provider identifies any concerns that need to be evaluated in person or the need to arrange testing (such as labs, EKG, etc.), we will make arrangements to do so.   ?  ?Although advances in technology are sophisticated, we cannot ensure that it will always work on either your end or our end.  If the connection with a video visit is poor, the visit may have to be switched to a telephone visit.  With either a video or telephone visit, we are not always able to ensure that we have a secure connection.    ? ?Also, by engaging in this virtual visit, you consent to the provision of healthcare. Additionally, you authorize for your insurance to be billed (if applicable) for the services provided during this visit.  ? ?I need to obtain your verbal consent now.   Are you willing to proceed with your visit today?  ?  ?Darrell [name] has provided verbal consent on 06/11/2021 for a virtual visit (video or telephone). ?  ?Gildardo Pounds, NP  ? ?Date: 06/11/2021 1:14 PM ? ? ?Virtual Visit via Video Note  ? ?IGildardo Pounds, connected with  Darrell [name]  ([id number], 13-Dec-1990) on 06/11/21 at 12:45 PM EDT by a video-enabled telemedicine application and verified that I am speaking with the correct  person using two identifiers. ? ?Location: ?Patient: Virtual Visit Location Patient: Home ?Provider: Virtual Visit Location Provider: Home Office ?  ?I discussed the limitations of evaluation and management by telemedicine and the availability of in person appointments. The patient expressed understanding and agreed to proceed.   ? ?History of Present Illness: ?Darrell [name] is a 31 y.o. who identifies as a male who was assigned male at birth, and is being seen today for viral URI. ? ?Darrell [name] states he left work early yesterday with fatigue, chills, body aches and headache.  He is currently taking Tylenol for headache.  He denies nausea, vomiting, diarrhea cough, or fever. ? ? ?Problems:  ?Patient Active Problem List  ? Diagnosis Date Noted  ? HTN (hypertension) 09/05/2018  ? AKI (acute kidney injury) ([facility]) 07/10/2016  ? CRI (chronic renal insufficiency), stage 1 01/11/2016  ? Smoker 11/23/2014  ? Screen for STD (sexually transmitted disease) 09/09/2014  ? Elevated blood pressure 05/20/2014  ? HIV disease ([facility]) 10/08/2013  ? HEADACHE, TENSION 11/09/2006  ? RHINITIS, ALLERGIC NEC 11/09/2006  ?  ?Allergies:  ?Allergies  ?Allergen Reactions  ? Sulfonamide Derivatives   ?  unknown  ? ?Medications:  ?Current Outpatient Medications:  ?  dolutegravir-lamiVUDine (DOVATO) 50-300 MG tablet, Take 1 tablet by mouth daily for 28 days., Disp: 28 tablet, Rfl: 0 ?  dolutegravir-lamiVUDine (DOVATO) 50-300 MG tablet, Take 1 tablet by mouth daily., Disp: 30 tablet, Rfl: 11 ?  Multiple Vitamin (MULTI-VITAMINS) TABS, Take by mouth., Disp: , Rfl:  ?  Omega-3 Fatty Acids (FISH OIL PO), Take by mouth., Disp: , Rfl:  ?  ondansetron (ZOFRAN) 4 MG tablet, Take 1 tablet (4 mg total) by mouth every 8 (eight) hours as needed for nausea or vomiting., Disp: 20 tablet, Rfl: 0 ? ?Observations/Objective: ?Patient is well-developed, well-nourished in no acute distress.  ?Resting comfortably at home.  ?Head is normocephalic, atraumatic.  ?No  labored breathing.  ?Speech is clear and coherent with logical content.  ?Patient is alert and oriented at baseline.  ? ? ?Assessment and Plan: ?1. Viral upper respiratory tract infection ?Instructed patient that symptoms should be self-limiting ?INSTRUCTIONS: use a humidifier for nasal congestion ?Drink plenty of fluids, rest and wash hands frequently to avoid the spread of infection ?Alternate tylenol and Motrin for relief of fever  ? ?Follow Up Instructions: ?I discussed the assessment and treatment plan with the patient. The patient was provided an opportunity to ask questions and all were answered. The patient agreed with the plan and demonstrated an understanding of the instructions.  A copy of instructions were sent to the patient via MyChart unless otherwise noted below.  ? ? ? ?The patient was advised to call back or seek an in-person evaluation if the symptoms worsen or if the condition fails to improve as anticipated. ? ?Time:  ?I spent 11 minutes with the patient via telehealth technology discussing the above problems/concerns.   ? ?Gildardo Pounds, NP  ?

## 2021-09-06 ENCOUNTER — Ambulatory Visit: Payer: Self-pay

## 2021-09-06 ENCOUNTER — Ambulatory Visit: Payer: Self-pay | Admitting: Infectious Disease

## 2021-09-13 ENCOUNTER — Ambulatory Visit (INDEPENDENT_AMBULATORY_CARE_PROVIDER_SITE_OTHER): Payer: Self-pay | Admitting: Infectious Disease

## 2021-09-13 ENCOUNTER — Other Ambulatory Visit: Payer: Self-pay

## 2021-09-13 ENCOUNTER — Ambulatory Visit: Payer: Self-pay

## 2021-09-13 ENCOUNTER — Encounter: Payer: Self-pay | Admitting: Infectious Disease

## 2021-09-13 VITALS — BP 120/85 | HR 61 | Temp 98.2°F | Ht 71.0 in | Wt 175.0 lb

## 2021-09-13 DIAGNOSIS — B20 Human immunodeficiency virus [HIV] disease: Secondary | ICD-10-CM

## 2021-09-13 MED ORDER — DOVATO 50-300 MG PO TABS: 1.0000 | ORAL_TABLET | Freq: Every day | ORAL | 11 refills | Status: DC

## 2021-09-13 NOTE — Progress Notes (Signed)
Chief complaint: Follow-up for HIV disease on Dovato  Subjective:    Patient ID: Darrell [name], male    DOB: 05-Nov-1990, 31 y.o.   MRN: [id number]  HPI  31 year old [place] American man with HIV, who tested positive in July 2015 after negative test in June of 2015. He has HIV + partner. His VL at intake was in the 300s and CD4 above 400.  No resistance data back. HLA b701 negative.Since then he has fully suppress his virus on TRIUMEQ --> DOVATO.   He had been interested in [place] but was considering moving to [place] versus [place] and did not want to be stuck needing to come to clinic every other month.  Darrell [name] has decided to stay in [place].  He remains interested in [place].  I also discussed potential enrollment in one of her long-acting treatment studies.      Past Medical History:  Diagnosis Date   AKI (acute kidney injury) ([facility]) 07/10/2016   HIV infection ([facility])    HTN (hypertension) 09/05/2018   Screen for STD (sexually transmitted disease) 09/09/2014   Smoker 11/23/2014    No past surgical history on file.  Family History  Problem Relation Age of Onset   Cancer Mother    Fibromyalgia Mother    Hypertension Mother       Social History   Socioeconomic History   Marital status: Single    Spouse name: Not on file   Number of children: Not on file   Years of education: Not on file   Highest education level: Not on file  Occupational History   Not on file  Tobacco Use   Smoking status: Light Smoker    Types: Cigarettes, E-cigarettes    Last attempt to quit: 06/21/2015    Years since quitting: 6.2   Smokeless tobacco: Never   Tobacco comments:    Daily  Substance and Sexual Activity   Alcohol use: Yes    Comment: socially   Drug use: Yes    Frequency: 7.0 times per week    Types: Marijuana    Comment: socially   Sexual activity: Yes    Partners: Male    Birth control/protection: Condom    Comment: declined condoms  Other Topics Concern   Not on file   Social History Narrative   Not on file   Social Determinants of Health   Financial Resource Strain: Not on file  Food Insecurity: Not on file  Transportation Needs: Not on file  Physical Activity: Not on file  Stress: Not on file  Social Connections: Not on file      Review of Systems  Constitutional:  Negative for activity change, appetite change, chills, diaphoresis, fatigue, fever and unexpected weight change.  HENT:  Negative for congestion, rhinorrhea, sinus pressure, sneezing, sore throat and trouble swallowing.   Eyes:  Negative for photophobia and visual disturbance.  Respiratory:  Negative for cough, chest tightness, shortness of breath, wheezing and stridor.   Cardiovascular:  Negative for chest pain, palpitations and leg swelling.  Gastrointestinal:  Negative for abdominal distention, abdominal pain, anal bleeding, blood in stool, constipation, diarrhea, nausea and vomiting.  Genitourinary:  Negative for difficulty urinating, dysuria, flank pain and hematuria.  Musculoskeletal:  Negative for arthralgias, back pain, gait problem, joint swelling and myalgias.  Skin:  Negative for color change, pallor, rash and wound.  Neurological:  Negative for dizziness, tremors, weakness and light-headedness.  Hematological:  Negative for adenopathy. Does not bruise/bleed easily.  Psychiatric/Behavioral:  Negative for agitation, behavioral problems, confusion, decreased concentration, dysphoric mood and sleep disturbance.            Objective:   Physical Exam Constitutional:      Appearance: He is well-developed.  HENT:     Head: Normocephalic and atraumatic.  Eyes:     Conjunctiva/sclera: Conjunctivae normal.  Cardiovascular:     Rate and Rhythm: Normal rate and regular rhythm.  Pulmonary:     Effort: Pulmonary effort is normal. No respiratory distress.     Breath sounds: No wheezing.  Abdominal:     General: There is no distension.     Palpations: Abdomen is soft.   Musculoskeletal:        General: No tenderness. Normal range of motion.     Cervical back: Normal range of motion and neck supple.  Skin:    General: Skin is warm and dry.     Coloration: Skin is not pale.     Findings: No erythema or rash.  Neurological:     General: No focal deficit present.     Mental Status: He is alert and oriented to person, place, and time.  Psychiatric:        Mood and Affect: Mood normal.        Behavior: Behavior normal.        Thought Content: Thought content normal.        Judgment: Judgment normal.         Assessment & Plan:    HIV disease:  I will give this information over to our research team to see if he might be a candidate for one of our long-acting studies including the [employer] study combining to broadly neutralizing monoclonal antibodies along with a LEN vs ViiV w  ONE bNAB + Cabotegravir.  Given his VERY low viral load at baseline his risk for VF I would think with Cabenuva also would be very low  I spent 37 minutes with the patient including than 50% of the time in face to face counseling of the patient re long acting therapies  along with review of medical records in preparation for the visit and during the visit and in coordination of his care.

## 2021-09-14 LAB — T-HELPER CELLS (CD4) COUNT (NOT AT ARMC)
CD4 % Helper T Cell: 44 % (ref 33–65)
CD4 T Cell Abs: 568 /uL (ref 400–1790)

## 2021-09-14 LAB — URINE CYTOLOGY ANCILLARY ONLY
Chlamydia: NEGATIVE
Comment: NEGATIVE
Comment: NORMAL
Neisseria Gonorrhea: NEGATIVE

## 2021-09-16 LAB — LIPID PANEL
Cholesterol: 139 mg/dL (ref <200)
HDL: 48 mg/dL (ref >=40)
LDL Cholesterol (Calc): 73 mg/dL (calc)
Non-HDL Cholesterol (Calc): 91 mg/dL (calc) (ref <130)
Total CHOL/HDL Ratio: 2.9 (calc) (ref <5.0)
Triglycerides: 97 mg/dL (ref <150)

## 2021-09-16 LAB — CBC WITH DIFFERENTIAL/PLATELET
Absolute Monocytes: 367 cells/uL (ref 200–950)
Basophils Absolute: 20 cells/uL (ref 0–200)
Basophils Relative: 0.6 %
Eosinophils Absolute: 99 cells/uL (ref 15–500)
Eosinophils Relative: 2.9 %
HCT: 44.5 % (ref 38.5–50.0)
Hemoglobin: 15.4 g/dL (ref 13.2–17.1)
Lymphs Abs: 1442 cells/uL (ref 850–3900)
MCH: 31.4 pg (ref 27.0–33.0)
MCHC: 34.6 g/dL (ref 32.0–36.0)
MCV: 90.8 fL (ref 80.0–100.0)
MPV: 8.7 fL (ref 7.5–12.5)
Monocytes Relative: 10.8 %
Neutro Abs: 1472 cells/uL — ABNORMAL LOW (ref 1500–7800)
Neutrophils Relative %: 43.3 %
Platelets: 288 10*3/uL (ref 140–400)
RBC: 4.9 10*6/uL (ref 4.20–5.80)
RDW: 12.3 % (ref 11.0–15.0)
Total Lymphocyte: 42.4 %
WBC: 3.4 10*3/uL — ABNORMAL LOW (ref 3.8–10.8)

## 2021-09-16 LAB — COMPLETE METABOLIC PANEL WITH GFR
AG Ratio: 1.7 (calc) (ref 1.0–2.5)
ALT: 16 U/L (ref 9–46)
AST: 15 U/L (ref 10–40)
Albumin: 4.1 g/dL (ref 3.6–5.1)
Alkaline phosphatase (APISO): 43 U/L (ref 36–130)
BUN: 11 mg/dL (ref 7–25)
CO2: 31 mmol/L (ref 20–32)
Calcium: 9.2 mg/dL (ref 8.6–10.3)
Chloride: 104 mmol/L (ref 98–110)
Creat: 1.25 mg/dL (ref 0.60–1.26)
Globulin: 2.4 g/dL (calc) (ref 1.9–3.7)
Glucose, Bld: 82 mg/dL (ref 65–99)
Potassium: 4.4 mmol/L (ref 3.5–5.3)
Sodium: 138 mmol/L (ref 135–146)
Total Bilirubin: 0.8 mg/dL (ref 0.2–1.2)
Total Protein: 6.5 g/dL (ref 6.1–8.1)
eGFR: 78 mL/min/{1.73_m2} (ref >=60)

## 2021-09-16 LAB — HIV-1 RNA QUANT-NO REFLEX-BLD
HIV 1 RNA Quant: NOT DETECTED Copies/mL
HIV-1 RNA Quant, Log: NOT DETECTED Log cps/mL

## 2021-12-14 ENCOUNTER — Telehealth: Payer: Self-pay | Admitting: Nurse Practitioner

## 2021-12-14 DIAGNOSIS — R110 Nausea: Secondary | ICD-10-CM

## 2021-12-14 DIAGNOSIS — F99 Mental disorder, not otherwise specified: Secondary | ICD-10-CM

## 2021-12-14 DIAGNOSIS — R197 Diarrhea, unspecified: Secondary | ICD-10-CM

## 2021-12-14 MED ORDER — ONDANSETRON HCL 4 MG PO TABS: 4.0000 mg | ORAL_TABLET | Freq: Three times a day (TID) | ORAL | 0 refills | Status: DC | PRN

## 2021-12-14 NOTE — Patient Instructions (Signed)
  Darrell [name], thank you for joining Chevis Pretty, FNP for today's virtual visit.  While this provider is not your primary care provider (PCP), if your PCP is located in our provider database this encounter information will be shared with them immediately following your visit.   [facility] account gives you access to today's visit and all your visits, tests, and labs performed at [facility] " click here if you don't have a [facility] account or go to [email or web address]  Consent: (Patient) Darrell [name] provided verbal consent for this virtual visit at the beginning of the encounter.  Current Medications:  Current Outpatient Medications:    ondansetron (ZOFRAN) 4 MG tablet, Take 1 tablet (4 mg total) by mouth every 8 (eight) hours as needed for nausea or vomiting., Disp: 20 tablet, Rfl: 0   dolutegravir-lamiVUDine (DOVATO) 50-300 MG tablet, Take 1 tablet by mouth daily., Disp: 30 tablet, Rfl: 11   Multiple Vitamin (MULTI-VITAMINS) TABS, Take by mouth. (Patient not taking: Reported on 09/13/2021), Disp: , Rfl:    Omega-3 Fatty Acids (FISH OIL PO), Take by mouth. (Patient not taking: Reported on 09/13/2021), Disp: , Rfl:    Medications ordered in this encounter:  Meds ordered this encounter  Medications   ondansetron (ZOFRAN) 4 MG tablet    Sig: Take 1 tablet (4 mg total) by mouth every 8 (eight) hours as needed for nausea or vomiting.    Dispense:  20 tablet    Refill:  0    Order Specific Question:   Supervising Provider    Answer:   Chase Picket [id number]     *If you need refills on other medications prior to your next appointment, please contact your pharmacy*  Follow-Up: Call back or seek an in-person evaluation if the symptoms worsen or if the condition fails to improve as anticipated.  [facility] [phone]  Other Instructions First 24 Hours-Clear liquids  popsicles  Jello  gatorade  Sprite Second  24 hours-Add Full liquids ( Liquids you cant see through) Third 24 hours- Bland diet ( foods that are baked or broiled)  *avoiding fried foods and highly spiced foods* During these 3 days  Avoid milk, cheese, ice cream or any other dairy products  Avoid caffeine- REMEMBER Mt. Dew and Mello Yellow contain lots of caffeine You should eat and drink in  Frequent small volumes If no improvement in symptoms or worsen in 2-3 days should RETRUN TO OFFICE or go to ER!      If you have been instructed to have an in-person evaluation today at a local Urgent Care facility, please use the link below. It will take you to a list of all of our available [facility], including address, phone number and hours of operation. Please do not delay care.  [facility]  If you or a family member do not have a primary care provider, use the link below to schedule a visit and establish care. When you choose a [facility] primary care physician or advanced practice provider, you gain a long-term partner in health. Find a Primary Care Provider  Learn more about [facility]'s in-office and virtual care options: [facility]

## 2021-12-14 NOTE — Progress Notes (Signed)
Virtual Visit Consent   Darrell [name], you are scheduled for a virtual visit with Mary-Margaret Hassell Done, [place], a [facility] provider, today.     Just as with appointments in the office, your consent must be obtained to participate.  Your consent will be active for this visit and any virtual visit you may have with one of our providers in the next 365 days.     If you have a MyChart account, a copy of this consent can be sent to you electronically.  All virtual visits are billed to your insurance company just like a traditional visit in the office.    As this is a virtual visit, video technology does not allow for your provider to perform a traditional examination.  This may limit your provider's ability to fully assess your condition.  If your provider identifies any concerns that need to be evaluated in person or the need to arrange testing (such as labs, EKG, etc.), we will make arrangements to do so.     Although advances in technology are sophisticated, we cannot ensure that it will always work on either your end or our end.  If the connection with a video visit is poor, the visit may have to be switched to a telephone visit.  With either a video or telephone visit, we are not always able to ensure that we have a secure connection.     I need to obtain your verbal consent now.   Are you willing to proceed with your visit today? YES   Diandre A Comes has provided verbal consent on 12/14/2021 for a virtual visit (video or telephone).   Mary-Margaret Hassell Done, FNP   Date: 12/14/2021 12:16 PM   Virtual Visit via Video Note   I, Mary-Margaret Hassell Done, connected with Darrell [name] ([id number], Jun 30, 1990) on 12/14/21 at 12:15 PM EDT by a video-enabled telemedicine application and verified that I am speaking with the correct person using two identifiers.  Location: Patient: Virtual Visit Location Patient: Home Provider: Virtual Visit Location Provider: Mobile   I discussed the  limitations of evaluation and management by telemedicine and the availability of in person appointments. The patient expressed understanding and agreed to proceed.    History of Present Illness: Darrell [name] is a 31 y.o. who identifies as a male who was assigned male at birth, and is being seen today for Nausea and diarrhea. Anxiety.  HPI: Patient ate chinese yesterday. This morning about 9:30 he developed diarrhea and nausea. He has had 4 bowel movements that were loose since this morning. No abdominal pain except when he has to go to the restroom.   Diarrhea  Associated symptoms include a fever. Pertinent negatives include no chills or vomiting.    Review of Systems  Constitutional:  Positive for fever. Negative for chills.  Gastrointestinal:  Positive for diarrhea and nausea. Negative for vomiting.    Problems:  Patient Active Problem List   Diagnosis Date Noted   HTN (hypertension) 09/05/2018   AKI (acute kidney injury) ([place]) 07/10/2016   CRI (chronic renal insufficiency), stage 1 01/11/2016   Smoker 11/23/2014   Screen for STD (sexually transmitted disease) 09/09/2014   Elevated blood pressure 05/20/2014   HIV disease ([place]) 10/08/2013   HEADACHE, TENSION 11/09/2006   RHINITIS, ALLERGIC NEC 11/09/2006    Allergies:  Allergies  Allergen Reactions   Sulfonamide Derivatives     unknown   Medications:  Current Outpatient Medications:    dolutegravir-lamiVUDine (DOVATO) 50-300 MG tablet,  Take 1 tablet by mouth daily., Disp: 30 tablet, Rfl: 11   Multiple Vitamin (MULTI-VITAMINS) TABS, Take by mouth. (Patient not taking: Reported on 09/13/2021), Disp: , Rfl:    Omega-3 Fatty Acids (FISH OIL PO), Take by mouth. (Patient not taking: Reported on 09/13/2021), Disp: , Rfl:    ondansetron (ZOFRAN) 4 MG tablet, Take 1 tablet (4 mg total) by mouth every 8 (eight) hours as needed for nausea or vomiting. (Patient not taking: Reported on 09/13/2021), Disp: 20 tablet, Rfl:  0  Observations/Objective: Patient is well-developed, well-nourished in no acute distress.  Resting comfortably  at home.  Head is normocephalic, atraumatic.  No labored breathing.  Speech is clear and coherent with logical content.  Patient is alert and oriented at baseline.    Assessment and Plan:  Darrell [name] in today with chief complaint of Nausea, Diarrhea, and Psychiatric Evaluation   1. Nausea First 24 Hours-Clear liquids  popsicles  Jello  gatorade  Sprite Second 24 hours-Add Full liquids ( Liquids you cant see through) Third 24 hours- Bland diet ( foods that are baked or broiled)  *avoiding fried foods and highly spiced foods* During these 3 days  Avoid milk, cheese, ice cream or any other dairy products  Avoid caffeine- REMEMBER Mt. Dew and Mello Yellow contain lots of caffeine You should eat and drink in  Frequent small volumes If no improvement in symptoms or worsen in 2-3 days should RETRUN TO OFFICE or go to ER!     2. Diarrhea, unspecified type Imodium AD OTC  3. Mental disorder Patient was told need face to face visit for depression and anxiety He denied suicidal thoughts   Follow Up Instructions: I discussed the assessment and treatment plan with the patient. The patient was provided an opportunity to ask questions and all were answered. The patient agreed with the plan and demonstrated an understanding of the instructions.  A copy of instructions were sent to the patient via MyChart.  The patient was advised to call back or seek an in-person evaluation if the symptoms worsen or if the condition fails to improve as anticipated.  Time:  I spent 12 minutes with the patient via telehealth technology discussing the above problems/concerns.    Mary-Margaret Hassell Done, FNP

## 2021-12-16 ENCOUNTER — Telehealth: Payer: Self-pay

## 2021-12-16 NOTE — Telephone Encounter (Signed)
Patient is scheduled for carol on 01/10/22 but has applied for FMLA due to his mental health in dealing w diagnosis. He wants to drop fmla papers off here and wants to make sure provider will sign them. Advised I will ask Dr Tommy Medal and someone will call him back. No longer has a PCP.

## 2021-12-19 NOTE — Telephone Encounter (Signed)
Patient waiting for FMLA papers to come in the mail. Once he has paperwork he will call to schedule appointment.  [place], CMA

## 2021-12-20 ENCOUNTER — Telehealth: Payer: Self-pay | Admitting: Physician Assistant

## 2021-12-20 DIAGNOSIS — F419 Anxiety disorder, unspecified: Secondary | ICD-10-CM

## 2021-12-20 DIAGNOSIS — F32A Depression, unspecified: Secondary | ICD-10-CM

## 2021-12-20 DIAGNOSIS — R519 Headache, unspecified: Secondary | ICD-10-CM

## 2021-12-20 NOTE — Progress Notes (Signed)
Virtual Visit Consent   [name], you are scheduled for a virtual visit with a [facility] provider today. Just as with appointments in the office, your consent must be obtained to participate. Your consent will be active for this visit and any virtual visit you may have with one of our providers in the next 365 days. If you have a MyChart account, a copy of this consent can be sent to you electronically.  As this is a virtual visit, video technology does not allow for your provider to perform a traditional examination. This may limit your provider's ability to fully assess your condition. If your provider identifies any concerns that need to be evaluated in person or the need to arrange testing (such as labs, EKG, etc.), we will make arrangements to do so. Although advances in technology are sophisticated, we cannot ensure that it will always work on either your end or our end. If the connection with a video visit is poor, the visit may have to be switched to a telephone visit. With either a video or telephone visit, we are not always able to ensure that we have a secure connection.  By engaging in this virtual visit, you consent to the provision of healthcare and authorize for your insurance to be billed (if applicable) for the services provided during this visit. Depending on your insurance coverage, you may receive a charge related to this service.  I need to obtain your verbal consent now. Are you willing to proceed with your visit today? Darrell [name] has provided verbal consent on 12/20/2021 for a virtual visit (video or telephone). Piedad Climes, [place]  Date: 12/20/2021 4:43 PM  Virtual Visit via Video Note   I, Piedad Climes, connected with  Darrell [name]  ([id number], [birth year]) on 12/20/21 at  4:30 PM EDT by a video-enabled telemedicine application and verified that I am speaking with the correct person using two identifiers.  Location: Patient: Virtual Visit  Location Patient: Home Provider: Virtual Visit Location Provider: Home Office   I discussed the limitations of evaluation and management by telemedicine and the availability of in person appointments. The patient expressed understanding and agreed to proceed.    History of Present Illness: Darrell [name] is a 31 y.o. who identifies as a male who was assigned male at birth, and is being seen today for mild frontal headache associated with some nasal congestion and sinus pressure. Denies prior history of allergies but over past couple of years noting similar issues with seasonal changes. Denies fever, chills. Denies recent travel. Is still having some ongoing issues with anxiety and depressed mood causing some occasional upset stomach. Is following with his ID doctor for this and awaiting FMLA and appt with counselor. Denies SI/HI. Was unable to make it to work today with his constellation of symptoms.     HPI: HPI  Problems:  Patient Active Problem List   Diagnosis Date Noted   HTN (hypertension) 09/05/2018   AKI (acute kidney injury) ([facility]) 07/10/2016   CRI (chronic renal insufficiency), stage 1 01/11/2016   Smoker 11/23/2014   Screen for STD (sexually transmitted disease) 09/09/2014   Elevated blood pressure 05/20/2014   HIV disease ([facility]) 10/08/2013   HEADACHE, TENSION 11/09/2006   RHINITIS, ALLERGIC NEC 11/09/2006    Allergies:  Allergies  Allergen Reactions   Sulfonamide Derivatives     unknown   Medications:  Current Outpatient Medications:    dolutegravir-lamiVUDine (DOVATO) 50-300 MG tablet, Take 1 tablet  by mouth daily., Disp: 30 tablet, Rfl: 11   Multiple Vitamin (MULTI-VITAMINS) TABS, Take by mouth. (Patient not taking: Reported on 09/13/2021), Disp: , Rfl:    Omega-3 Fatty Acids (FISH OIL PO), Take by mouth. (Patient not taking: Reported on 09/13/2021), Disp: , Rfl:   Observations/Objective: Patient is well-developed, well-nourished in no acute distress.  Resting  comfortably at home.  Head is normocephalic, atraumatic.  No labored breathing. Speech is clear and coherent with logical content.  Patient is alert and oriented at baseline.   Assessment and Plan: 1. Sinus headache  Increase fluids. Start Xyzal as directed for sinus inflammation. Can use saline nasal rinse and Flonase OTC if needed for any ongoing allergy symptoms as well. Tylenol if needed for headache. In-person follow-up precautions reviewed.  2. Anxiety and depression  Mostly due to diagnosis of HIV and trying to reconcile with things. Resources given to help establish with PCP. Recommend he attend counseling as being set up with ID. A PCP can help with medical intervention if needed. Community resources sent to patient.   Follow Up Instructions: I discussed the assessment and treatment plan with the patient. The patient was provided an opportunity to ask questions and all were answered. The patient agreed with the plan and demonstrated an understanding of the instructions.  A copy of instructions were sent to the patient via MyChart unless otherwise noted below.   The patient was advised to call back or seek an in-person evaluation if the symptoms worsen or if the condition fails to improve as anticipated.  Time:  I spent 10 minutes with the patient via telehealth technology discussing the above problems/concerns.    [name], PA-C

## 2021-12-20 NOTE — Patient Instructions (Signed)
Darrell [name], thank you for joining Darrell Rio, PA-C for today's virtual visit.  While this provider is not your primary care provider (PCP), if your PCP is located in our provider database this encounter information will be shared with them immediately following your visit.   [facility] account gives you access to today's visit and all your visits, tests, and labs performed at [facility] " click here if you don't have a [facility] account or go to [email or web address]  Consent: (Patient) Darrell [name] provided verbal consent for this virtual visit at the beginning of the encounter.  Current Medications:  Current Outpatient Medications:    dolutegravir-lamiVUDine (DOVATO) 50-300 MG tablet, Take 1 tablet by mouth daily., Disp: 30 tablet, Rfl: 11   Multiple Vitamin (MULTI-VITAMINS) TABS, Take by mouth. (Patient not taking: Reported on 09/13/2021), Disp: , Rfl:    Omega-3 Fatty Acids (FISH OIL PO), Take by mouth. (Patient not taking: Reported on 09/13/2021), Disp: , Rfl:    ondansetron (ZOFRAN) 4 MG tablet, Take 1 tablet (4 mg total) by mouth every 8 (eight) hours as needed for nausea or vomiting., Disp: 20 tablet, Rfl: 0   Medications ordered in this encounter:  No orders of the defined types were placed in this encounter.    *If you need refills on other medications prior to your next appointment, please contact your pharmacy*  Follow-Up: Call back or seek an in-person evaluation if the symptoms worsen or if the condition fails to improve as anticipated.  Darrell [name] [phone]  Other Instructions Please keep hydrated and rest. You can use a saline nasal rinse or Flonase over-the-counter to help with nasal congestion and sinus pressure. Tylenol or ibuprofen as needed. Take the Xyzal as directed.  Follow dietary recommendations below and make sure to follow-up with your provider about ongoing stomach issues.  See  link below to help get established with a new PCP.  Bland Diet A bland diet may consist of soft foods or foods that are not high in fat or are not greasy, acidic, or spicy. Avoiding certain foods may cause less irritation to your mouth, throat, stomach, or gastrointestinal tract. Avoiding certain foods may make you feel better. Everyone's tolerances are different. A bland diet should be based on what you can tolerate and what may cause discomfort. What is my plan? Your health care provider or dietitian may recommend specific changes to your diet to treat your symptoms. These changes may include: Eating small meals frequently. Cooking food until it is soft enough to chew easily. Taking the time to chew your food thoroughly, so it is easy to swallow and digest. Avoiding foods that cause you discomfort. These may include spicy food, fried food, greasy foods, hard-to-chew foods, or citrus fruits and juices. Drinking slowly. What are tips for following this plan? Reading food labels To reduce fiber intake, look for food labels that say "whole," such as whole wheat or whole grain. Shopping Avoid food items that may have nuts or seeds. Avoid vegetables that may make you gassy or have a tough texture, such as broccoli, cauliflower, or corn. Cooking Cook foods thoroughly so they have a soft texture. Meal planning Make sure you include foods from all food groups to eat a balanced diet. Eat a variety of types of foods. Eat foods and drink beverages that do not cause you discomfort. These may include soups and broths with cooked meats, pasta, and vegetables. Lifestyle Sit up after meals,  avoid tight clothing, and take time to eat and chew your food slowly. Ask your health care provider whether you should take dietary supplements. General information Mildly season your foods. Some seasonings, such as cayenne pepper, vinegar, or hot sauce, may cause irritation. The foods, beverages, or seasonings to  avoid should be based on individual tolerance. What foods should I eat? Fruits Canned or cooked fruit such as peaches, pears, or applesauce. Bananas. Vegetables Well-cooked vegetables. Canned or cooked vegetables such as carrots, green beans, beets, or spinach. Mashed or boiled potatoes. Grains  Hot cereals, such as cream of wheat and processed oatmeal. Rice. Bread, crackers, pasta, or tortillas made from refined white flour. Meats and other proteins  Eggs. Creamy peanut butter or other nut butters. Lean, well-cooked tender meats, such as beef, pork, chicken, or fish. Dairy Low-fat dairy products such as milk, cottage cheese, or yogurt. Beverages  Water. Herbal tea. Apple juice. Fats and oils Mild salad dressings. Canola or olive oil. Sweets and desserts Low-fat pudding, custard, or ice cream. Fruit gelatin. The items listed above may not be a complete list of foods and beverages you can eat. Contact a dietitian for more information. What foods should I avoid? Fruits Citrus fruits, such as oranges and grapefruit. Fruits with a stringy texture. Fruits that have lots of seeds, such as kiwi or strawberries. Dried fruits. Vegetables Raw, uncooked vegetables. Salads. Grains Whole grain breads, muffins, and cereals. Meats and other proteins Tough, fibrous meats. Highly seasoned meat such as corned beef, smoked meats, or fish. Processed high-fat meats such as brats, hot dogs, or sausage. Dairy Full-fat dairy foods such as ice cream and cheese. Beverages Caffeinated drinks. Alcohol. Seasonings and condiments Strongly flavored seasonings or condiments. Hot sauce. Salsa. Other foods Spicy foods. Fried or greasy foods. Sour foods, such as pickled or fermented foods like sauerkraut. Foods high in fiber. The items listed above may not be a complete list of foods and beverages you should avoid. Contact a dietitian for more information. Summary A bland diet should be based on individual  tolerance. It may consist of foods that are soft textured and do not have a lot of fat, fiber, acid, or seasonings. A bland diet may be recommended because avoiding certain foods, beverages, or spices may make you feel better. This information is not intended to replace advice given to you by your health care provider. Make sure you discuss any questions you have with your health care provider. Document Revised: 12/27/2020 Document Reviewed: 12/27/2020 Elsevier Patient Education  2023 Elsevier Inc.    If you have been instructed to have an in-person evaluation today at a local Urgent Care facility, please use the link below. It will take you to a list of all of our available [facility], including address, phone number and hours of operation. Please do not delay care.  [facility]  If you or a family member do not have a primary care provider, use the link below to schedule a visit and establish care. When you choose a [facility] primary care physician or advanced practice provider, you gain a long-term partner in health. Find a Primary Care Provider  Learn more about [facility] in-office and virtual care options: [facility] - Get Care Now

## 2022-01-02 ENCOUNTER — Ambulatory Visit: Payer: Self-pay | Admitting: Infectious Disease

## 2022-01-02 ENCOUNTER — Other Ambulatory Visit: Payer: Self-pay

## 2022-01-03 ENCOUNTER — Other Ambulatory Visit: Payer: Self-pay

## 2022-01-03 ENCOUNTER — Ambulatory Visit (INDEPENDENT_AMBULATORY_CARE_PROVIDER_SITE_OTHER): Payer: Self-pay | Admitting: Infectious Disease

## 2022-01-03 ENCOUNTER — Encounter: Payer: Self-pay | Admitting: *Deleted

## 2022-01-03 ENCOUNTER — Encounter: Payer: Self-pay | Admitting: Infectious Disease

## 2022-01-03 VITALS — BP 135/86 | HR 75 | Temp 98.1°F | Ht 71.0 in | Wt 182.0 lb

## 2022-01-03 DIAGNOSIS — B20 Human immunodeficiency virus [HIV] disease: Secondary | ICD-10-CM

## 2022-01-03 DIAGNOSIS — F331 Major depressive disorder, recurrent, moderate: Secondary | ICD-10-CM

## 2022-01-03 DIAGNOSIS — Z23 Encounter for immunization: Secondary | ICD-10-CM

## 2022-01-03 DIAGNOSIS — Z7185 Encounter for immunization safety counseling: Secondary | ICD-10-CM

## 2022-01-03 MED ORDER — DOVATO 50-300 MG PO TABS: 1.0000 | ORAL_TABLET | Freq: Every day | ORAL | 11 refills | Status: DC

## 2022-01-03 NOTE — Patient Instructions (Signed)
We can have you meet with Okey Regal here  Another very good resource is  [facility] Address: [address], [place], [place] [address] Hours:  Open ? Closes 2:30?PM Phone: [phone] Check insurance info

## 2022-01-03 NOTE — Progress Notes (Signed)
Chief complaint: Follow-up for HIV disease on Dovato with some symptoms of depression that have worsened recently  Subjective:    Patient ID: Darrell [name], male    DOB: 12/04/1990, 31 y.o.   MRN: [id number]  HPI  31 year old [place] American man with HIV, who tested positive in July 2015 after negative test in June of 2015. He has HIV + partner. His VL at intake was in the 300s and CD4 above 400.  No resistance data back. HLA b701 negative.Since then he has fully suppress his virus on TRIUMEQ --> DOVATO.    Jakaiden have interested in [place]. We screened him for [employer] study with two: Antibodies plus injectable and [place].  Unfortunately was a screen failure.  He is very much interested in brace which is a study of an even broader neutralizing monoclonal antibody with ViiV given either IV vs subcutaneous infusion + IM cabotegravir   Keyondre is struggled with some depressive symptoms recently and had to take some time off from work.  He brought in FMLA forms from his employer which I filled out today.  He does not want to start on antidepressant therapy at this point time but is interested in counseling.       Past Medical History:  Diagnosis Date   AKI (acute kidney injury) ([place]) 07/10/2016   HIV infection ([place])    HTN (hypertension) 09/05/2018   Screen for STD (sexually transmitted disease) 09/09/2014   Smoker 11/23/2014    No past surgical history on file.  Family History  Problem Relation Age of Onset   Cancer Mother    Fibromyalgia Mother    Hypertension Mother       Social History   Socioeconomic History   Marital status: Single    Spouse name: Not on file   Number of children: Not on file   Years of education: Not on file   Highest education level: Not on file  Occupational History   Not on file  Tobacco Use   Smoking status: Light Smoker    Types: Cigarettes, E-cigarettes    Last attempt to quit: 06/21/2015    Years since quitting: 6.5   Smokeless  tobacco: Never   Tobacco comments:    Daily  Substance and Sexual Activity   Alcohol use: Yes    Comment: socially   Drug use: Yes    Frequency: 7.0 times per week    Types: Marijuana    Comment: socially   Sexual activity: Yes    Partners: Male    Birth control/protection: Condom    Comment: declined condoms  Other Topics Concern   Not on file  Social History Narrative   Not on file   Social Determinants of Health   Financial Resource Strain: Not on file  Food Insecurity: Not on file  Transportation Needs: Not on file  Physical Activity: Not on file  Stress: Not on file  Social Connections: Not on file      Review of Systems  Constitutional:  Negative for activity change, appetite change, chills, diaphoresis, fatigue, fever and unexpected weight change.  HENT:  Negative for congestion, rhinorrhea, sinus pressure, sneezing, sore throat and trouble swallowing.   Eyes:  Negative for photophobia and visual disturbance.  Respiratory:  Negative for cough, chest tightness, shortness of breath, wheezing and stridor.   Cardiovascular:  Negative for chest pain, palpitations and leg swelling.  Gastrointestinal:  Negative for abdominal distention, abdominal pain, anal bleeding, blood in stool, constipation, diarrhea, nausea  and vomiting.  Genitourinary:  Negative for difficulty urinating, dysuria, flank pain and hematuria.  Musculoskeletal:  Negative for arthralgias, back pain, gait problem, joint swelling and myalgias.  Skin:  Negative for color change, pallor, rash and wound.  Neurological:  Negative for dizziness, tremors, weakness and light-headedness.  Hematological:  Negative for adenopathy. Does not bruise/bleed easily.  Psychiatric/Behavioral:  Positive for dysphoric mood. Negative for agitation, behavioral problems, confusion, decreased concentration and sleep disturbance.            Objective:   Physical Exam Constitutional:      Appearance: He is well-developed.   HENT:     Head: Normocephalic and atraumatic.  Eyes:     Conjunctiva/sclera: Conjunctivae normal.  Cardiovascular:     Rate and Rhythm: Normal rate and regular rhythm.  Pulmonary:     Effort: Pulmonary effort is normal. No respiratory distress.     Breath sounds: No wheezing.  Abdominal:     General: There is no distension.     Palpations: Abdomen is soft.  Musculoskeletal:        General: No tenderness. Normal range of motion.     Cervical back: Normal range of motion and neck supple.  Skin:    General: Skin is warm and dry.     Coloration: Skin is not pale.     Findings: No erythema or rash.  Neurological:     General: No focal deficit present.     Mental Status: He is alert and oriented to person, place, and time.  Psychiatric:        Mood and Affect: Mood normal.        Behavior: Behavior normal.        Thought Content: Thought content normal.        Judgment: Judgment normal.         Assessment & Plan:     HIV disease:  I will add order HIV viral load CD4 count CBC with differential CMP, RPR GC and chlamydia and I will continue  Starr A Rineer's Dovato  prescription.  Hopefully he will be eligible for EMBRACE study and can enter into it  If he is not eligible I am very supportive of [place] for him  Depression: Gave him contact information for Arbie Cookey our counselor as well as [facility].  Did not want an SSRI at this point in time.  Screening for STI screen for gonorrhea chlamydia urine oropharynx rectum check RPR.  Vaccine counseling recommended flu and COVID-19 vaccines

## 2022-01-03 NOTE — Addendum Note (Signed)
Addended by: Tressa Busman T on: 01/03/2022 04:25 PM   Modules accepted: Orders

## 2022-01-04 LAB — URINE CYTOLOGY ANCILLARY ONLY
Chlamydia: NEGATIVE
Comment: NEGATIVE
Comment: NORMAL
Neisseria Gonorrhea: NEGATIVE

## 2022-01-04 LAB — CYTOLOGY, (ORAL, ANAL, URETHRAL) ANCILLARY ONLY
Chlamydia: NEGATIVE
Chlamydia: NEGATIVE
Comment: NEGATIVE
Comment: NEGATIVE
Comment: NORMAL
Comment: NORMAL
Neisseria Gonorrhea: NEGATIVE
Neisseria Gonorrhea: NEGATIVE

## 2022-01-04 LAB — T-HELPER CELLS (CD4) COUNT (NOT AT ARMC)
CD4 % Helper T Cell: 40 % (ref 33–65)
CD4 T Cell Abs: 683 /uL (ref 400–1790)

## 2022-01-06 LAB — COMPLETE METABOLIC PANEL WITH GFR
AG Ratio: 1.6 (calc) (ref 1.0–2.5)
ALT: 18 U/L (ref 9–46)
AST: 18 U/L (ref 10–40)
Albumin: 4.3 g/dL (ref 3.6–5.1)
Alkaline phosphatase (APISO): 46 U/L (ref 36–130)
BUN/Creatinine Ratio: 13 (calc) (ref 6–22)
BUN: 16 mg/dL (ref 7–25)
CO2: 31 mmol/L (ref 20–32)
Calcium: 9.2 mg/dL (ref 8.6–10.3)
Chloride: 106 mmol/L (ref 98–110)
Creat: 1.27 mg/dL — ABNORMAL HIGH (ref 0.60–1.26)
Globulin: 2.7 g/dL (calc) (ref 1.9–3.7)
Glucose, Bld: 102 mg/dL — ABNORMAL HIGH (ref 65–99)
Potassium: 4.2 mmol/L (ref 3.5–5.3)
Sodium: 140 mmol/L (ref 135–146)
Total Bilirubin: 0.9 mg/dL (ref 0.2–1.2)
Total Protein: 7 g/dL (ref 6.1–8.1)
eGFR: 77 mL/min/{1.73_m2} (ref >=60)

## 2022-01-06 LAB — CBC WITH DIFFERENTIAL/PLATELET
Absolute Monocytes: 420 cells/uL (ref 200–950)
Basophils Absolute: 20 cells/uL (ref 0–200)
Basophils Relative: 0.5 %
Eosinophils Absolute: 172 cells/uL (ref 15–500)
Eosinophils Relative: 4.3 %
HCT: 44 % (ref 38.5–50.0)
Hemoglobin: 15.4 g/dL (ref 13.2–17.1)
Lymphs Abs: 1932 cells/uL (ref 850–3900)
MCH: 31.2 pg (ref 27.0–33.0)
MCHC: 35 g/dL (ref 32.0–36.0)
MCV: 89.1 fL (ref 80.0–100.0)
MPV: 8.7 fL (ref 7.5–12.5)
Monocytes Relative: 10.5 %
Neutro Abs: 1456 cells/uL — ABNORMAL LOW (ref 1500–7800)
Neutrophils Relative %: 36.4 %
Platelets: 258 10*3/uL (ref 140–400)
RBC: 4.94 10*6/uL (ref 4.20–5.80)
RDW: 12.7 % (ref 11.0–15.0)
Total Lymphocyte: 48.3 %
WBC: 4 10*3/uL (ref 3.8–10.8)

## 2022-01-06 LAB — HIV-1 RNA QUANT-NO REFLEX-BLD
HIV 1 RNA Quant: NOT DETECTED Copies/mL
HIV-1 RNA Quant, Log: NOT DETECTED Log cps/mL

## 2022-01-06 LAB — LIPID PANEL
Cholesterol: 159 mg/dL (ref <200)
HDL: 57 mg/dL (ref >=40)
LDL Cholesterol (Calc): 83 mg/dL (calc)
Non-HDL Cholesterol (Calc): 102 mg/dL (calc) (ref <130)
Total CHOL/HDL Ratio: 2.8 (calc) (ref <5.0)
Triglycerides: 95 mg/dL (ref <150)

## 2022-01-06 LAB — RPR: RPR Ser Ql: NONREACTIVE

## 2022-01-10 ENCOUNTER — Ambulatory Visit: Payer: Self-pay

## 2022-01-10 ENCOUNTER — Other Ambulatory Visit: Payer: Self-pay

## 2022-01-16 ENCOUNTER — Other Ambulatory Visit: Payer: Self-pay

## 2022-01-16 ENCOUNTER — Encounter (INDEPENDENT_AMBULATORY_CARE_PROVIDER_SITE_OTHER): Payer: Self-pay | Admitting: Infectious Disease

## 2022-01-16 VITALS — BP 119/75 | HR 58 | Temp 97.9°F | Ht 71.0 in | Wt 176.1 lb

## 2022-01-16 DIAGNOSIS — Z006 Encounter for examination for normal comparison and control in clinical research program: Secondary | ICD-10-CM

## 2022-01-16 NOTE — Progress Notes (Signed)
Subjective:  Chief complaint: Al is here for screening visit for EMBRACE study   Patient ID: Darrell [name], male    DOB: April 28, 1990, 31 y.o.   MRN: [id number]  HPI  Darrell [name] is a 31 year old Black man living with HIV who is here for screening for EMBRACE study.  Past Medical History:  Diagnosis Date   AKI (acute kidney injury) (HCC) 07/10/2016   HIV infection (HCC)    HTN (hypertension) 09/05/2018   Screen for STD (sexually transmitted disease) 09/09/2014   Smoker 11/23/2014    No past surgical history on file.  Family History  Problem Relation Age of Onset   Cancer Mother    Fibromyalgia Mother    Hypertension Mother       Social History   Socioeconomic History   Marital status: Single    Spouse name: Not on file   Number of children: Not on file   Years of education: Not on file   Highest education level: Not on file  Occupational History   Not on file  Tobacco Use   Smoking status: Light Smoker    Types: Cigarettes, E-cigarettes    Last attempt to quit: 06/21/2015    Years since quitting: 6.5   Smokeless tobacco: Never   Tobacco comments:    Daily  Substance and Sexual Activity   Alcohol use: Yes    Comment: socially   Drug use: Yes    Frequency: 7.0 times per week    Types: Marijuana    Comment: socially   Sexual activity: Yes    Partners: Male    Birth control/protection: Condom    Comment: declined condoms  Other Topics Concern   Not on file  Social History Narrative   Not on file   Social Determinants of Health   Financial Resource Strain: Not on file  Food Insecurity: Not on file  Transportation Needs: Not on file  Physical Activity: Not on file  Stress: Not on file  Social Connections: Not on file    Allergies  Allergen Reactions   Sulfonamide Derivatives     unknown     Current Outpatient Medications:    dolutegravir-lamiVUDine (DOVATO) 50-300 MG tablet, Take 1 tablet by mouth daily., Disp: 30 tablet, Rfl: 11   Multiple Vitamin  (MULTI-VITAMINS) TABS, Take by mouth., Disp: , Rfl:    Omega-3 Fatty Acids (FISH OIL PO), Take by mouth. (Patient not taking: Reported on 09/13/2021), Disp: , Rfl:    Review of Systems  Constitutional:  Negative for activity change, appetite change, chills, diaphoresis, fatigue, fever and unexpected weight change.  HENT:  Negative for congestion, rhinorrhea, sinus pressure, sneezing, sore throat and trouble swallowing.   Eyes:  Negative for photophobia and visual disturbance.  Respiratory:  Negative for cough, chest tightness, shortness of breath, wheezing and stridor.   Cardiovascular:  Negative for chest pain, palpitations and leg swelling.  Gastrointestinal:  Negative for abdominal distention, abdominal pain, anal bleeding, blood in stool, constipation, diarrhea, nausea and vomiting.  Genitourinary:  Negative for difficulty urinating, dysuria, flank pain and hematuria.  Musculoskeletal:  Negative for arthralgias, back pain, gait problem, joint swelling and myalgias.  Skin:  Negative for color change, pallor, rash and wound.  Neurological:  Negative for dizziness, tremors, weakness and light-headedness.  Hematological:  Negative for adenopathy. Does not bruise/bleed easily.  Psychiatric/Behavioral:  Negative for agitation, behavioral problems, confusion, decreased concentration, dysphoric mood and sleep disturbance.        Objective:   Physical  Exam Constitutional:      General: He is not in acute distress.    Appearance: Normal appearance. He is well-developed and normal weight. He is not ill-appearing or diaphoretic.  HENT:     Head: Normocephalic and atraumatic.     Nose: Nose normal.     Mouth/Throat:     Mouth: Mucous membranes are moist.     Pharynx: Oropharynx is clear. No oropharyngeal exudate or posterior oropharyngeal erythema.  Eyes:     General:        Right eye: No discharge.        Left eye: No discharge.     Conjunctiva/sclera: Conjunctivae normal.  Cardiovascular:      Rate and Rhythm: Normal rate and regular rhythm.     Heart sounds: No murmur heard.    No friction rub. No gallop.  Pulmonary:     Effort: Pulmonary effort is normal. No respiratory distress.     Breath sounds: Normal breath sounds. No stridor. No wheezing or rhonchi.  Chest:     Chest wall: No tenderness.  Abdominal:     General: Bowel sounds are normal. There is no distension.     Palpations: Abdomen is soft. There is no mass.  Musculoskeletal:        General: No tenderness. Normal range of motion.     Cervical back: Normal range of motion and neck supple.  Lymphadenopathy:     Head:     Right side of head: No submental, submandibular or tonsillar adenopathy.     Left side of head: No submental, submandibular or tonsillar adenopathy.     Cervical: No cervical adenopathy.  Skin:    General: Skin is warm and dry.     Coloration: Skin is not jaundiced or pale.     Findings: No bruising, erythema, lesion or rash.  Neurological:     General: No focal deficit present.     Mental Status: He is alert and oriented to person, place, and time.     Cranial Nerves: Cranial nerves 2-12 are intact.     Sensory: Sensation is intact.     Motor: Motor function is intact.  Psychiatric:        Attention and Perception: Attention and perception normal.        Mood and Affect: Mood normal.        Speech: Speech normal.        Behavior: Behavior normal.        Thought Content: Thought content normal.        Cognition and Memory: Cognition and memory normal.        Judgment: Judgment normal.           Assessment & Plan:    Normal exam  EKG's were done x 3 which show NSR

## 2022-01-17 ENCOUNTER — Telehealth: Payer: Self-pay | Admitting: Family Medicine

## 2022-01-17 DIAGNOSIS — A059 Bacterial foodborne intoxication, unspecified: Secondary | ICD-10-CM

## 2022-01-17 NOTE — Progress Notes (Unsigned)
Therapist met with client as scheduled to continue rapport building. Therapist introduced and shared some information about herself and encouraged the same from client in order to build trust and openness within the counseling relationship. Therapist discussed and reviewed treatment plans with client based on the result from client's assessment. Therapist discussed client current mental health symptoms /diagnosis of DSM-5, as well as recommendations and target population for various eligibility services. Therapist assessed for SI/HI during session and will follow-up with client during the next session.   Client attended session with therapist as scheduled and presented alert; orient X4. On start of session, client affect appeared euthymic; affect was congruent with client report. Appearance was relaxed/casual, and attitude was appropriate. Client was receptive to rapport building by sharing information about him/herself, such as: expressing likes, dislikes, and strengths. Client was able to make connections between consequences, challenges and alternative thoughts of mental health recovery. Client reported that he was having issues with his job and with getting the proper care for his mother. Client reports that this is the first time that he's deeply thought about the fact that he was diagnosis with HIV. Client reports that lately he's been thinking about the mistakes he's made. Client progress toward therapeutic goal(s) are minimal at this time. Client denied SI/HI.

## 2022-01-17 NOTE — Progress Notes (Signed)
Virtual Visit Consent   Darrell [name], you are scheduled for a virtual visit with a  provider today. Just as with appointments in the office, your consent must be obtained to participate. Your consent will be active for this visit and any virtual visit you may have with one of our providers in the next 365 days. If you have a MyChart account, a copy of this consent can be sent to you electronically.  As this is a virtual visit, video technology does not allow for your provider to perform a traditional examination. This may limit your provider's ability to fully assess your condition. If your provider identifies any concerns that need to be evaluated in person or the need to arrange testing (such as labs, EKG, etc.), we will make arrangements to do so. Although advances in technology are sophisticated, we cannot ensure that it will always work on either your end or our end. If the connection with a video visit is poor, the visit may have to be switched to a telephone visit. With either a video or telephone visit, we are not always able to ensure that we have a secure connection.  By engaging in this virtual visit, you consent to the provision of healthcare and authorize for your insurance to be billed (if applicable) for the services provided during this visit. Depending on your insurance coverage, you may receive a charge related to this service.  I need to obtain your verbal consent now. Are you willing to proceed with your visit today? Darrell [name] has provided verbal consent on 01/17/2022 for a virtual visit (video or telephone). Darrell Finner, NP  Date: 01/17/2022 2:29 PM  Virtual Visit via Video Note   I, Darrell [name], connected with  Darrell [name]  ([id number], 1990/08/29) on 01/17/22 at  2:30 PM EST by a video-enabled telemedicine application and verified that I am speaking with the correct person using two identifiers.  Location: Patient: Virtual Visit Location  Patient: Home Provider: Virtual Visit Location Provider: Home Office   I discussed the limitations of evaluation and management by telemedicine and the availability of in person appointments. The patient expressed understanding and agreed to proceed.    History of Present Illness: Darrell [name] is a 31 y.o. who identifies as a male who was assigned male at birth, and is being seen today for upset stomach after eating street tacos- for dinner. Reports waking up with stomach cramping, diarrhea and pain. Reports some chills, no fever. Denies URI symptoms, no Covid, no new medications.   Problems:  Patient Active Problem List   Diagnosis Date Noted   HTN (hypertension) 09/05/2018   AKI (acute kidney injury) (HCC) 07/10/2016   CRI (chronic renal insufficiency), stage 1 01/11/2016   Smoker 11/23/2014   Screen for STD (sexually transmitted disease) 09/09/2014   Elevated blood pressure 05/20/2014   HIV disease (HCC) 10/08/2013   HEADACHE, TENSION 11/09/2006   RHINITIS, ALLERGIC NEC 11/09/2006    Allergies:  Allergies  Allergen Reactions   Sulfonamide Derivatives     unknown   Medications:  Current Outpatient Medications:    dolutegravir-lamiVUDine (DOVATO) 50-300 MG tablet, Take 1 tablet by mouth daily., Disp: 30 tablet, Rfl: 11   Multiple Vitamin (MULTI-VITAMINS) TABS, Take by mouth., Disp: , Rfl:    Omega-3 Fatty Acids (FISH OIL PO), Take by mouth. (Patient not taking: Reported on 09/13/2021), Disp: , Rfl:   Observations/Objective: Patient is well-developed, well-nourished in no acute distress.  Resting comfortably  at home.  Head is normocephalic, atraumatic.  No labored breathing.  Speech is clear and coherent with logical content.  Patient is alert and oriented at baseline.    Assessment and Plan:  1. Food poisoning  -OTC reviewed and on AVS -Hydration, rest, BLAND diet reviewed   In person needed if not improving to make sure dehydration does not occur.   Reviewed  side effects, risks and benefits of medication.    Patient acknowledged agreement and understanding of the plan.   Past Medical, Surgical, Social History, Allergies, and Medications have been Reviewed.    Follow Up Instructions: I discussed the assessment and treatment plan with the patient. The patient was provided an opportunity to ask questions and all were answered. The patient agreed with the plan and demonstrated an understanding of the instructions.  A copy of instructions were sent to the patient via MyChart unless otherwise noted below.    The patient was advised to call back or seek an in-person evaluation if the symptoms worsen or if the condition fails to improve as anticipated.  Time:  I spent 10 minutes with the patient via telehealth technology discussing the above problems/concerns.    Darrell Finner, NP

## 2022-01-17 NOTE — Patient Instructions (Addendum)
Darrell [name], thank you for joining Freddy Finner, NP for today's virtual visit.  While this provider is not your primary care provider (PCP), if your PCP is located in our provider database this encounter information will be shared with them immediately following your visit.   A [facility] MyChart account gives you access to today's visit and all your visits, tests, and labs performed at [facility] " click here if you don't have a [facility] MyChart account or go to [email or web address]  Consent: (Patient) Darrell [name] provided verbal consent for this virtual visit at the beginning of the encounter.  Current Medications:  Current Outpatient Medications:    dolutegravir-lamiVUDine (DOVATO) 50-300 MG tablet, Take 1 tablet by mouth daily., Disp: 30 tablet, Rfl: 11   Multiple Vitamin (MULTI-VITAMINS) TABS, Take by mouth., Disp: , Rfl:    Omega-3 Fatty Acids (FISH OIL PO), Take by mouth. (Patient not taking: Reported on 09/13/2021), Disp: , Rfl:    Medications ordered in this encounter:  No orders of the defined types were placed in this encounter.    *If you need refills on other medications prior to your next appointment, please contact your pharmacy*  Follow-Up: Call back or seek an in-person evaluation if the symptoms worsen or if the condition fails to improve as anticipated.  [facility] [phone]  Other Instructions Food Poisoning Food poisoning is caused by eating or drinking foods or drinks that are spoiled. In most cases, food poisoning is mild and lasts 2-7 days. However, some cases can be serious, especially for people who have a weak body defense system (immune system), like older people, children and babies, and pregnant women. What are the causes? This condition is caused by eating foods that contain viruses, bacteria, parasites, or mold. This can happen by: Not washing your hands well enough or often enough. Not storing food  properly. Preparing, serving, and storing food on surfaces that are not clean. Cooking or eating with utensils that are not clean. Not cooking food to the right temperature. Viruses, bacteria, or parasites can hurt the intestine. This often causes very bad watery poop (diarrhea). The most common causes of this condition are: Viruses, such as: Norovirus. Rotavirus. Bacteria, such as: Salmonella. Listeria. E. coli (Escherichia coli). Parasites, such as: Giardia. Toxoplasma gondii. What are the signs or symptoms? Feeling like you may vomit (nauseous). Vomiting. Cramping or belly pain. Watery poop. Fever. Chills. Muscle aches. Not having enough water in the body (dehydration). How is this treated? Making sure that you get enough to drink. Taking medicines. In very bad cases, you may need: To be treated in a hospital. To get fluids through an IV tube. Follow these instructions at home: Eating and drinking  Drink enough fluids to keep your pee (urine) pale yellow. You may need to drink small amounts of clear liquids often. Avoid: Milk. Caffeine. Alcohol. Ask your doctor how you should get enough fluid in your body. Eat small meals often instead of eating large meals. Medicines Take over-the-counter and prescription medicines only as told by your doctor. Ask your doctor if you should keep taking any of your regular medicines. If you were prescribed an antibiotic medicine, take it as told by your doctor. Do not stop taking the antibiotic even if you start to feel better. General instructions  Wash your hands with soap and water for at least 20 seconds: Before you prepare food. After you go to the bathroom (use the toilet). Make sure that  the people who live with you also wash their hands often. Rest at home until you feel better. Clean surfaces that you touch with a product that contains chlorine bleach. Keep all follow-up visits. How is this prevented? Before and after  you handle raw foods: Wash your hands. Wash surfaces used to prepare the food. Wash utensils. Do not prepare or store raw meat in the same place you prepare or store fresh fruits and vegetables. Keep refrigerated foods colder than 70F (5C). Serve hot foods right away or keep them heated above 170F (60C). Store dry foods in cool, dry spaces. Keep them away from too much heat or moisture. Throw out any foods that: Do not smell right. Are in cans that are bulging. Heat canned foods fully before you taste them. Drink bottled or germ-free (sterile) water when you travel. Get help right away if: You have trouble: Breathing. Swallowing. Talking. Moving. You have blurry vision. You faint. The whites of your eyes turn yellow (jaundice). You cannot eat or drink without vomiting. You continue to vomit or have watery poop. You start to have pain in your belly, your belly pain gets worse, or your belly pain stays in one small area. You have a fever. You have blood or mucus in your poop (stools), or your poop looks dark black and tarry. You have signs of not having enough water in your body, such as: Dark pee, very little pee, or no pee. Not making tears while crying. Dry mouth or lips. Sunken eyes. Being sleepy. Feeling weak. Being dizzy. These symptoms may be an emergency. Get help right away. Call 911. Do not wait to see if the symptoms will go away. Do not drive yourself to the hospital. Summary Food poisoning is an illness that is caused by eating or drinking spoiled foods or drinks. Symptoms may include feeling like you may vomit, vomiting, and having watery poop. In most cases, the illness will get better on its own in 2-7 days. In very bad cases, you may need to stay at the hospital. This information is not intended to replace advice given to you by your health care provider. Make sure you discuss any questions you have with your health care provider. Document Revised:  12/27/2020 Document Reviewed: 12/27/2020 Elsevier Patient Education  2023 Elsevier Inc.  [place] Diet A bland diet may consist of soft foods or foods that are not high in fat or are not greasy, acidic, or spicy. Avoiding certain foods may cause less irritation to your mouth, throat, stomach, or gastrointestinal tract. Avoiding certain foods may make you feel better. Everyone's tolerances are different. A bland diet should be based on what you can tolerate and what may cause discomfort. What is my plan? Your health care provider or dietitian may recommend specific changes to your diet to treat your symptoms. These changes may include: Eating small meals frequently. Cooking food until it is soft enough to chew easily. Taking the time to chew your food thoroughly, so it is easy to swallow and digest. Avoiding foods that cause you discomfort. These may include spicy food, fried food, greasy foods, hard-to-chew foods, or citrus fruits and juices. Drinking slowly. What are tips for following this plan? Reading food labels To reduce fiber intake, look for food labels that say "whole," such as whole wheat or whole grain. Shopping Avoid food items that may have nuts or seeds. Avoid vegetables that may make you gassy or have a tough texture, such as broccoli, cauliflower, or corn. Cooking  Adriana Simas foods thoroughly so they have a soft texture. Meal planning Make sure you include foods from all food groups to eat a balanced diet. Eat a variety of types of foods. Eat foods and drink beverages that do not cause you discomfort. These may include soups and broths with cooked meats, pasta, and vegetables. Lifestyle Sit up after meals, avoid tight clothing, and take time to eat and chew your food slowly. Ask your health care provider whether you should take dietary supplements. General information Mildly season your foods. Some seasonings, such as cayenne pepper, vinegar, or hot sauce, may cause  irritation. The foods, beverages, or seasonings to avoid should be based on individual tolerance. What foods should I eat? Fruits Canned or cooked fruit such as peaches, pears, or applesauce. Bananas. Vegetables Well-cooked vegetables. Canned or cooked vegetables such as carrots, green beans, beets, or spinach. Mashed or boiled potatoes. Grains  Hot cereals, such as cream of wheat and processed oatmeal. Rice. Bread, crackers, pasta, or tortillas made from refined white flour. Meats and other proteins  Eggs. Creamy peanut butter or other nut butters. Lean, well-cooked tender meats, such as beef, pork, chicken, or fish. Dairy Low-fat dairy products such as milk, cottage cheese, or yogurt. Beverages  Water. Herbal tea. Apple juice. Fats and oils Mild salad dressings. Canola or olive oil. Sweets and desserts Low-fat pudding, custard, or ice cream. Fruit gelatin. The items listed above may not be a complete list of foods and beverages you can eat. Contact a dietitian for more information. What foods should I avoid? Fruits Citrus fruits, such as oranges and grapefruit. Fruits with a stringy texture. Fruits that have lots of seeds, such as kiwi or strawberries. Dried fruits. Vegetables Raw, uncooked vegetables. Salads. Grains Whole grain breads, muffins, and cereals. Meats and other proteins Tough, fibrous meats. Highly seasoned meat such as corned beef, smoked meats, or fish. Processed high-fat meats such as brats, hot dogs, or sausage. Dairy Full-fat dairy foods such as ice cream and cheese. Beverages Caffeinated drinks. Alcohol. Seasonings and condiments Strongly flavored seasonings or condiments. Hot sauce. Salsa. Other foods Spicy foods. Fried or greasy foods. Sour foods, such as pickled or fermented foods like sauerkraut. Foods high in fiber. The items listed above may not be a complete list of foods and beverages you should avoid. Contact a dietitian for more  information. Summary A bland diet should be based on individual tolerance. It may consist of foods that are soft textured and do not have a lot of fat, fiber, acid, or seasonings. A bland diet may be recommended because avoiding certain foods, beverages, or spices may make you feel better. This information is not intended to replace advice given to you by your health care provider. Make sure you discuss any questions you have with your health care provider. Document Revised: 12/27/2020 Document Reviewed: 12/27/2020 Elsevier Patient Education  2023 Elsevier Inc.     If you have been instructed to have an in-person evaluation today at a local Urgent Care facility, please use the link below. It will take you to a list of all of our available [facility], including address, phone number and hours of operation. Please do not delay care.  [facility]  If you or a family member do not have a primary care provider, use the link below to schedule a visit and establish care. When you choose a [facility] primary care physician or advanced practice provider, you gain a long-term partner in health.  Find a Primary Care Provider  Learn more about [facility] in-office and virtual care options: [facility]

## 2022-02-22 ENCOUNTER — Ambulatory Visit (INDEPENDENT_AMBULATORY_CARE_PROVIDER_SITE_OTHER): Payer: Federal, State, Local not specified - PPO

## 2022-02-22 ENCOUNTER — Encounter: Payer: Self-pay | Admitting: Emergency Medicine

## 2022-02-22 ENCOUNTER — Other Ambulatory Visit: Payer: Self-pay

## 2022-02-22 ENCOUNTER — Ambulatory Visit
Admission: EM | Admit: 2022-02-22 | Discharge: 2022-02-22 | Disposition: A | Discharge status: Home / Self Care | Payer: Federal, State, Local not specified - PPO | Attending: Physician Assistant | Admitting: Physician Assistant

## 2022-02-22 DIAGNOSIS — M62838 Other muscle spasm: Secondary | ICD-10-CM

## 2022-02-22 DIAGNOSIS — M542 Cervicalgia: Secondary | ICD-10-CM

## 2022-02-22 MED ORDER — METHOCARBAMOL 500 MG PO TABS: 500.0000 mg | ORAL_TABLET | Freq: Two times a day (BID) | ORAL | 0 refills | Status: DC

## 2022-02-22 MED ORDER — LIDOCAINE 5 % EX PTCH: 1.0000 | MEDICATED_PATCH | CUTANEOUS | 0 refills | Status: DC

## 2022-02-22 NOTE — ED Provider Notes (Signed)
[facility]    CSN: [id number] Arrival date & time: 02/22/22  1410      History   Chief Complaint Chief Complaint  Patient presents with   Neck Pain    HPI Darrell [name] is a 32 y.o. male.   Patient presents today with a 6-week history of neck pain.  Reports that over the past year he has had several injuries including falling down stairs and having his right shoulder dislocated.  This has caused intermittent neck pain but he denies any more recent trauma or injury before this episode of symptoms began.  He has tried Aleve and Tylenol without improvement of symptoms.  Reports pain is rated 9 on a 0-10 pain scale, localized to right paraspinal muscles and along trapezius, described as aching, no aggravating or alleviating factors identified.  He denies any weakness, numbness, paresthesias in his hand.    Past Medical History:  Diagnosis Date   AKI (acute kidney injury) ([place]) 07/10/2016   HIV infection ([place])    HTN (hypertension) 09/05/2018   Screen for STD (sexually transmitted disease) 09/09/2014   Smoker 11/23/2014    Patient Active Problem List   Diagnosis Date Noted   HTN (hypertension) 09/05/2018   AKI (acute kidney injury) ([place]) 07/10/2016   CRI (chronic renal insufficiency), stage 1 01/11/2016   Smoker 11/23/2014   Screen for STD (sexually transmitted disease) 09/09/2014   Elevated blood pressure 05/20/2014   HIV disease ([place]) 10/08/2013   HEADACHE, TENSION 11/09/2006   RHINITIS, ALLERGIC NEC 11/09/2006    History reviewed. No pertinent surgical history.     Home Medications    Prior to Admission medications   Medication Sig Start Date End Date Taking? Authorizing Provider  lidocaine (LIDODERM) 5 % Place 1 patch onto the skin daily. Remove & Discard patch within 12 hours or as directed by MD 02/22/22  Yes Izabell Schalk, Derry Skill, PA-C  methocarbamol (ROBAXIN) 500 MG tablet Take 1 tablet (500 mg total) by mouth 2 (two) times daily. 02/22/22  Yes Houa Nie K,  PA-C  dolutegravir-lamiVUDine (DOVATO) 50-300 MG tablet Take 1 tablet by mouth daily. 01/03/22   Truman Hayward, MD  Multiple Vitamin (MULTI-VITAMINS) TABS Take by mouth.    [provider]  Omega-3 Fatty Acids (FISH OIL PO) Take by mouth. Patient not taking: Reported on 09/13/2021    [provider]    Family History Family History  Problem Relation Age of Onset   Cancer Mother    Fibromyalgia Mother    Hypertension Mother     Social History Social History   Tobacco Use   Smoking status: Light Smoker    Types: Cigarettes, E-cigarettes    Last attempt to quit: 06/21/2015    Years since quitting: 6.6   Smokeless tobacco: Never   Tobacco comments:    Daily  Substance Use Topics   Alcohol use: Yes    Comment: socially   Drug use: Yes    Frequency: 7.0 times per week    Types: Marijuana    Comment: socially     Allergies   Sulfonamide derivatives   Review of Systems Review of Systems  Constitutional:  Positive for activity change. Negative for appetite change, fatigue and fever.  Gastrointestinal:  Negative for abdominal pain, diarrhea, nausea and vomiting.  Musculoskeletal:  Positive for neck pain. Negative for arthralgias and myalgias.  Neurological:  Negative for dizziness, weakness, light-headedness, numbness and headaches.     Physical Exam Triage Vital Signs ED  Triage Vitals  Enc Vitals Group     BP 02/22/22 1527 130/84     Pulse Rate 02/22/22 1527 (!) 58     Resp 02/22/22 1527 19     Temp 02/22/22 1527 98.1 F (36.7 C)     Temp Source 02/22/22 1527 Oral     SpO2 02/22/22 1527 97 %     Weight 02/22/22 1528 176 lb 2.4 oz (79.9 kg)     Height 02/22/22 1528 5\' 11"  (1.803 m)     Head Circumference --      Peak Flow --      Pain Score 02/22/22 1527 8     Pain Loc --      Pain Edu? --      Excl. in [place]? --    No data found.  Updated Vital Signs BP 130/84 (BP Location: Right Arm)   Pulse (!) 58   Temp 98.1 F (36.7 C) (Oral)    Resp 19   Ht 5\' 11"  (1.803 m)   Wt 176 lb 2.4 oz (79.9 kg)   SpO2 97%   BMI 24.57 kg/m   Visual Acuity Right Eye Distance:   Left Eye Distance:   Bilateral Distance:    Right Eye Near:   Left Eye Near:    Bilateral Near:     Physical Exam Vitals reviewed.  Constitutional:      General: He is awake.     Appearance: Normal appearance. He is well-developed. He is not ill-appearing.     Comments: Very pleasant male appears stated age in no acute distress sitting comfortably in exam room  HENT:     Head: Normocephalic and atraumatic.  Cardiovascular:     Rate and Rhythm: Normal rate and regular rhythm.     Heart sounds: Normal heart sounds, S1 normal and S2 normal. No murmur heard. Pulmonary:     Effort: Pulmonary effort is normal.     Breath sounds: Normal breath sounds. No stridor. No wheezing, rhonchi or rales.     Comments: Clear to auscultation bilaterally Musculoskeletal:     Cervical back: Spasms, tenderness and bony tenderness present.     Thoracic back: No tenderness or bony tenderness.     Lumbar back: No tenderness or bony tenderness.     Comments: Decreased into motion with rotation and lateral flexion of neck secondary to pain.  Pain percussion over C3-C7.  No deformity or step-off noted.  Spasm noted along right trapezius.  Normal pincer grip strength.  Hand neurovascularly intact.  Neurological:     Mental Status: He is alert.  Psychiatric:        Behavior: Behavior is cooperative.      UC Treatments / Results  Labs (all labs ordered are listed, but only abnormal results are displayed) Labs Reviewed - No data to display  EKG   Radiology DG Cervical Spine Complete  Result Date: 02/22/2022 CLINICAL DATA:  Neck pain for 6 weeks. EXAM: CERVICAL SPINE - COMPLETE 4+ VIEW COMPARISON:  None Available. FINDINGS: Straightening of the upper cervical lordosis. The cervical vertebral bodies are normally aligned. Disc spaces are maintained. The facets are normally  aligned. No acute bony findings. No abnormal prevertebral soft tissue swelling. The C1-2 articulations are maintained. The lung apices are clear. IMPRESSION: 1. Straightening of the upper cervical lordosis could be due to muscle spasm or pain. 2. No acute bony findings or degenerative changes. Electronically Signed   By: Marijo Sanes M.D.   On: 02/22/2022 16:18  Procedures Procedures (including critical care time)  Medications Ordered in UC Medications - No data to display  Initial Impression / Assessment and Plan / UC Course  I have reviewed the triage vital signs and the nursing notes.  Pertinent labs & imaging results that were available during my care of the patient were reviewed by me and considered in my medical decision making (see chart for details).     X-ray was obtained given bony tenderness on exam which showed no acute osseous abnormality but did show straightening of cervical lordosis concerning for muscle spasm.  Patient was started on Robaxin up to twice a day.  Discussed that this can be sedating and he is not to drive or drink alcohol with taking it.  Can use over-the-counter medication including Tylenol for pain relief.  He was prescribed a lidocaine patch to be used once every 24 hours (he is to put this on for 12 hours and then remove it for 12 hours).  Discussed that if his symptoms or not improving quickly he should follow-up with sports medicine was given contact information for local provider with instruction to call to schedule an appointment.  He can use conservative treatment measures including heat, rest, stretch for symptom relief.  Discussed that if he has any worsening or changing symptoms he needs to be seen immediately.  Strict return precautions given.  Work excuse note provided.  Final Clinical Impressions(s) / UC Diagnoses   Final diagnoses:  Cervicalgia  Muscle spasms of neck     Discharge Instructions      Your x-ray did not show any problems with  the bones.  It did show that your curve in your neck is more straight which is consistent with a muscle spasm.  Please start Robaxin twice a day.  This will make you sleepy so do not drive or drink alcohol with taking it.  You can use over-the-counter medication including Tylenol for pain relief.  Use lidocaine patch during the day.  Remove this at night and only use 1 patch per 24 hours.  Use heat and gentle stretch for symptom relief.  I do recommend you follow-up with a sports medicine provider if your symptoms are not improving quickly.  If anything worsens you have fever, headache, worsening pain, numbness or tingling in your hand, weakness you need to be seen immediately.     ED Prescriptions     Medication Sig Dispense Auth. Provider   methocarbamol (ROBAXIN) 500 MG tablet Take 1 tablet (500 mg total) by mouth 2 (two) times daily. 20 tablet Dayn Barich K, PA-C   lidocaine (LIDODERM) 5 % Place 1 patch onto the skin daily. Remove & Discard patch within 12 hours or as directed by MD 7 patch Lief Palmatier K, PA-C      PDMP not reviewed this encounter.   Terrilee Croak, PA-C 02/22/22 1643

## 2022-02-22 NOTE — ED Triage Notes (Signed)
Pt c/o 8/10 neck pain for the since the end of November 2023 getting worse today, no new injury.

## 2022-02-22 NOTE — Discharge Instructions (Signed)
Your x-ray did not show any problems with the bones.  It did show that your curve in your neck is more straight which is consistent with a muscle spasm.  Please start Robaxin twice a day.  This will make you sleepy so do not drive or drink alcohol with taking it.  You can use over-the-counter medication including Tylenol for pain relief.  Use lidocaine patch during the day.  Remove this at night and only use 1 patch per 24 hours.  Use heat and gentle stretch for symptom relief.  I do recommend you follow-up with a sports medicine provider if your symptoms are not improving quickly.  If anything worsens you have fever, headache, worsening pain, numbness or tingling in your hand, weakness you need to be seen immediately.

## 2022-03-16 ENCOUNTER — Ambulatory Visit: Payer: Self-pay | Admitting: Infectious Disease

## 2022-03-17 ENCOUNTER — Telehealth: Payer: Federal, State, Local not specified - PPO | Admitting: Family Medicine

## 2022-03-17 DIAGNOSIS — R197 Diarrhea, unspecified: Secondary | ICD-10-CM

## 2022-03-17 NOTE — Progress Notes (Signed)
Virtual Visit Consent   Darrell [name], you are scheduled for a virtual visit with a [facility] provider today. Just as with appointments in the office, your consent must be obtained to participate. Your consent will be active for this visit and any virtual visit you may have with one of our providers in the next 365 days. If you have a MyChart account, a copy of this consent can be sent to you electronically.  As this is a virtual visit, video technology does not allow for your provider to perform a traditional examination. This may limit your provider's ability to fully assess your condition. If your provider identifies any concerns that need to be evaluated in person or the need to arrange testing (such as labs, EKG, etc.), we will make arrangements to do so. Although advances in technology are sophisticated, we cannot ensure that it will always work on either your end or our end. If the connection with a video visit is poor, the visit may have to be switched to a telephone visit. With either a video or telephone visit, we are not always able to ensure that we have a secure connection.  By engaging in this virtual visit, you consent to the provision of healthcare and authorize for your insurance to be billed (if applicable) for the services provided during this visit. Depending on your insurance coverage, you may receive a charge related to this service.  I need to obtain your verbal consent now. Are you willing to proceed with your visit today? [name] has provided verbal consent on 03/17/2022 for a virtual visit (video or telephone). Darrell Nims, FNP  Date: 03/17/2022 1:17 PM  Virtual Visit via Video Note   I, Darrell [name], connected with  Darrell [name]  ([id number], Jul 02, 1990) on 03/17/22 at  1:15 PM EST by a video-enabled telemedicine application and verified that I am speaking with the correct person using two identifiers.  Location: Patient: Virtual Visit Location Patient:  Home Provider: Virtual Visit Location Provider: Home Office   I discussed the limitations of evaluation and management by telemedicine and the availability of in person appointments. The patient expressed understanding and agreed to proceed.    History of Present Illness: Darrell [name] is a 32 y.o. who identifies as a male who was assigned male at birth, and is being seen today for diarrhea for 1.5 days. No nausea or vomiting. Since eating his mothers lasagna. No fever. Needs a work note. He has apptmt with pcp later this month on GI issues.   HPI: HPI  Problems:  Patient Active Problem List   Diagnosis Date Noted   HTN (hypertension) 09/05/2018   AKI (acute kidney injury) ([place]) 07/10/2016   CRI (chronic renal insufficiency), stage 1 01/11/2016   Smoker 11/23/2014   Screen for STD (sexually transmitted disease) 09/09/2014   Elevated blood pressure 05/20/2014   HIV disease ([place]) 10/08/2013   HEADACHE, TENSION 11/09/2006   RHINITIS, ALLERGIC NEC 11/09/2006    Allergies:  Allergies  Allergen Reactions   Sulfonamide Derivatives     unknown   Medications:  Current Outpatient Medications:    dolutegravir-lamiVUDine (DOVATO) 50-300 MG tablet, Take 1 tablet by mouth daily., Disp: 30 tablet, Rfl: 11   lidocaine (LIDODERM) 5 %, Place 1 patch onto the skin daily. Remove & Discard patch within 12 hours or as directed by MD, Disp: 7 patch, Rfl: 0   methocarbamol (ROBAXIN) 500 MG tablet, Take 1 tablet (500 mg total) by mouth 2 (  two) times daily., Disp: 20 tablet, Rfl: 0   Multiple Vitamin (MULTI-VITAMINS) TABS, Take by mouth., Disp: , Rfl:    Omega-3 Fatty Acids (FISH OIL PO), Take by mouth. (Patient not taking: Reported on 09/13/2021), Disp: , Rfl:   Observations/Objective: Patient is well-developed, well-nourished in no acute distress.  Resting comfortably  at home.  Head is normocephalic, atraumatic.  No labored breathing.  Speech is clear and coherent with logical content.  Patient  is alert and oriented at baseline.    Assessment and Plan: 1. Diarrhea, unspecified type  Increase fluids, no milk or dairy, urgent care if sx persist or worsen,  Follow Up Instructions: I discussed the assessment and treatment plan with the patient. The patient was provided an opportunity to ask questions and all were answered. The patient agreed with the plan and demonstrated an understanding of the instructions.  A copy of instructions were sent to the patient via MyChart unless otherwise noted below.   Patient has requested to receive PHI (AVS, Work Notes, etc) pertaining to this video visit through e-mail as they are currently without active [place]. They have voiced understand that email is not considered secure and their health information could be viewed by someone other than the patient.   The patient was advised to call back or seek an in-person evaluation if the symptoms worsen or if the condition fails to improve as anticipated.  Time:  I spent 10 minutes with the patient via telehealth technology discussing the above problems/concerns.    Darrell Nims, FNP

## 2022-03-17 NOTE — Patient Instructions (Signed)
Diarrhea, Adult Diarrhea is frequent loose and sometimes watery bowel movements. Diarrhea can make you feel weak and cause you to become dehydrated. Dehydration is a condition in which there is not enough water or other fluids in the body. Dehydration can make you tired and thirsty, cause you to have a dry mouth, and decrease how often you urinate. Diarrhea typically lasts 2-3 days. However, it can last longer if it is a sign of something more serious. It is important to treat your diarrhea as told by your health care provider. Follow these instructions at home: Eating and drinking     Follow these recommendations as told by your health care provider: Take an oral rehydration solution (ORS). This is an over-the-counter medicine that helps return your body to its normal balance of nutrients and water. It is found at pharmacies and retail stores. Drink enough fluid to keep your urine pale yellow. Drink fluids such as water, diluted fruit juice, and low-calorie sports drinks. You can drink milk also, if desired. Sucking on ice chips is another way to get fluids. Avoid drinking fluids that contain a lot of sugar or caffeine, such as soda, energy drinks, and regular sports drinks. Avoid alcohol. Eat bland, easy-to-digest foods in small amounts as you are able. These foods include bananas, applesauce, rice, lean meats, toast, and crackers. Avoid spicy or fatty foods.  Medicines Take over-the-counter and prescription medicines only as told by your health care provider. If you were prescribed antibiotics, take them as told by your health care provider. Do not stop using the antibiotic even if you start to feel better. General instructions  Wash your hands often using soap and water for at least 20 seconds. If soap and water are not available, use hand sanitizer. Others in the household should wash their hands as well. Hands should be washed: After using the toilet or changing a diaper. Before  preparing, cooking, or serving food. While caring for a sick person or while visiting someone in a hospital. Rest at home while you recover. Take a warm bath to relieve any burning or pain from frequent diarrhea episodes. Watch your condition for any changes. Contact a health care provider if: You have a fever. Your diarrhea gets worse. You have new symptoms. You vomit every time you eat or drink. You feel light-headed, dizzy, or have a headache. You have muscle cramps. You have signs of dehydration, such as: Dark urine, very little urine, or no urine. Cracked lips. Dry mouth. Sunken eyes. Sleepiness. Weakness. You have bloody or black stools or stools that look like tar. You have severe pain, cramping, or bloating in your abdomen. Your skin feels cold and clammy. You feel confused. Get help right away if: You have chest pain or your heart is beating very quickly. You have trouble breathing or you are breathing very quickly. You feel extremely weak or you faint. These symptoms may be an emergency. Get help right away. Call 911. Do not wait to see if the symptoms will go away. Do not drive yourself to the hospital. This information is not intended to replace advice given to you by your health care provider. Make sure you discuss any questions you have with your health care provider. Document Revised: 07/26/2021 Document Reviewed: 07/26/2021 Elsevier Patient Education  [place].

## 2022-03-22 DIAGNOSIS — G8929 Other chronic pain: Secondary | ICD-10-CM | POA: Insufficient documentation

## 2022-03-22 DIAGNOSIS — B20 Human immunodeficiency virus [HIV] disease: Secondary | ICD-10-CM | POA: Diagnosis not present

## 2022-03-22 DIAGNOSIS — M25511 Pain in right shoulder: Secondary | ICD-10-CM | POA: Diagnosis not present

## 2022-04-04 ENCOUNTER — Telehealth: Payer: Self-pay

## 2022-04-04 NOTE — Telephone Encounter (Signed)
Patient called, says he has not been feeling well since Friday last week and was told recently that a coworker tested positive for covid. He would like to know where to get a free covid test.   Advised him we could put him on the lab schedule here for a PCR test, but that insurance might still charge him and that it takes 2-3 days for results to come back.   He will look around longer to see if he can find a free test and call us back if he wants to come in for a lab appointment.   Beryle Flock, RN

## 2022-04-11 ENCOUNTER — Encounter (INDEPENDENT_AMBULATORY_CARE_PROVIDER_SITE_OTHER): Payer: Self-pay | Admitting: *Deleted

## 2022-04-11 ENCOUNTER — Other Ambulatory Visit: Payer: Self-pay

## 2022-04-11 VITALS — BP 118/78 | HR 61 | Temp 98.1°F | Wt 181.4 lb

## 2022-04-11 DIAGNOSIS — Z006 Encounter for examination for normal comparison and control in clinical research program: Secondary | ICD-10-CM

## 2022-04-11 NOTE — Research (Signed)
Darrell [name] was here to rescreen for the [facility] study. We obtained informed consent first after reviewing the study again. He just got over covid last week and now tests negative. He has some rt should pain that has been going on for awhile and he will be seeing PT for that. EKGs and PE were done. We will have to wait up to 10 weeks on the antibody sensitivity testing to see if he is eligible for the study.

## 2022-04-17 ENCOUNTER — Ambulatory Visit (INDEPENDENT_AMBULATORY_CARE_PROVIDER_SITE_OTHER): Payer: Federal, State, Local not specified - PPO | Admitting: Infectious Disease

## 2022-04-17 ENCOUNTER — Other Ambulatory Visit: Payer: Self-pay

## 2022-04-17 ENCOUNTER — Encounter: Payer: Self-pay | Admitting: Infectious Disease

## 2022-04-17 VITALS — BP 137/94 | HR 80 | Temp 98.1°F | Ht 71.0 in | Wt 181.0 lb

## 2022-04-17 DIAGNOSIS — Z720 Tobacco use: Secondary | ICD-10-CM | POA: Diagnosis not present

## 2022-04-17 DIAGNOSIS — B20 Human immunodeficiency virus [HIV] disease: Secondary | ICD-10-CM | POA: Diagnosis not present

## 2022-04-17 DIAGNOSIS — I10 Essential (primary) hypertension: Secondary | ICD-10-CM | POA: Diagnosis not present

## 2022-04-17 DIAGNOSIS — Z7185 Encounter for immunization safety counseling: Secondary | ICD-10-CM

## 2022-04-17 DIAGNOSIS — F32A Depression, unspecified: Secondary | ICD-10-CM

## 2022-04-17 DIAGNOSIS — Z800 Family history of malignant neoplasm of digestive organs: Secondary | ICD-10-CM | POA: Diagnosis not present

## 2022-04-17 DIAGNOSIS — F419 Anxiety disorder, unspecified: Secondary | ICD-10-CM

## 2022-04-17 DIAGNOSIS — U071 COVID-19: Secondary | ICD-10-CM

## 2022-04-17 HISTORY — DX: COVID-19: U07.1

## 2022-04-17 HISTORY — DX: Encounter for immunization safety counseling: Z71.85

## 2022-04-17 HISTORY — DX: Depression, unspecified: F32.A

## 2022-04-17 HISTORY — DX: Tobacco use: Z72.0

## 2022-04-17 NOTE — Progress Notes (Signed)
Subjective:  Chief complaint follow-up for HIV disease depression anxiety and need for filling out of FMLA paperwork  Patient ID: Darrell [name], male    DOB: 1990-03-28, 32 y.o.   MRN: [id number]  HPI  Darrell [name] is a 32 year old 32 man with HIV, but has been perfectly controlled more recently on Dovato.  He did screen for the [facility] study had an artificially lowered CD4 count in the context of recent COVID-19 infection.  We are waiting on repeat test of his CD4 count and the assay to determine if his virus is sensitive to the broadly neutralizing antibody of the study.  He also needed FMLA paperwork filled out.  He has been suffering from depression and anxiety in part due to the stigmatizing nature of his HIV diagnosis.  He is seeing a counselor at x 3 times per week.  He for started seeing counselor at clinic in November 2023 with Arbie Cookey.    Past Medical History:  Diagnosis Date   AKI (acute kidney injury) ([place]) 07/10/2016   HIV infection ([place])    HTN (hypertension) 09/05/2018   Screen for STD (sexually transmitted disease) 09/09/2014   Smoker 11/23/2014    No past surgical history on file.  Family History  Problem Relation Age of Onset   Cancer Mother    Fibromyalgia Mother    Hypertension Mother       Social History   Socioeconomic History   Marital status: Single    Spouse name: Not on file   Number of children: Not on file   Years of education: Not on file   Highest education level: Not on file  Occupational History   Not on file  Tobacco Use   Smoking status: Light Smoker    Types: Cigarettes, E-cigarettes    Last attempt to quit: 06/21/2015    Years since quitting: 6.8   Smokeless tobacco: Never   Tobacco comments:    Daily  Substance and Sexual Activity   Alcohol use: Yes    Comment: socially   Drug use: Yes    Frequency: 7.0 times per week    Types: Marijuana    Comment: socially   Sexual activity: Yes    Partners: Male    Birth  control/protection: Condom    Comment: declined condoms  Other Topics Concern   Not on file  Social History Narrative   Not on file   Social Determinants of Health   Financial Resource Strain: Not on file  Food Insecurity: Not on file  Transportation Needs: Not on file  Physical Activity: Not on file  Stress: Not on file  Social Connections: Not on file    Allergies  Allergen Reactions   Sulfonamide Derivatives     unknown     Current Outpatient Medications:    dolutegravir-lamiVUDine (DOVATO) 50-300 MG tablet, Take 1 tablet by mouth daily., Disp: 30 tablet, Rfl: 11   lidocaine (LIDODERM) 5 %, Place 1 patch onto the skin daily. Remove & Discard patch within 12 hours or as directed by MD, Disp: 7 patch, Rfl: 0   methocarbamol (ROBAXIN) 500 MG tablet, Take 1 tablet (500 mg total) by mouth 2 (two) times daily., Disp: 20 tablet, Rfl: 0   Multiple Vitamin (MULTI-VITAMINS) TABS, Take by mouth., Disp: , Rfl:    Omega-3 Fatty Acids (FISH OIL PO), Take by mouth. (Patient not taking: Reported on 09/13/2021), Disp: , Rfl:    Review of Systems  Constitutional:  Negative for activity change, appetite  change, chills, diaphoresis, fatigue, fever and unexpected weight change.  HENT:  Negative for congestion, rhinorrhea, sinus pressure, sneezing, sore throat and trouble swallowing.   Eyes:  Negative for photophobia and visual disturbance.  Respiratory:  Negative for cough, chest tightness, shortness of breath, wheezing and stridor.   Cardiovascular:  Negative for chest pain, palpitations and leg swelling.  Gastrointestinal:  Negative for abdominal distention, abdominal pain, anal bleeding, blood in stool, constipation, diarrhea, nausea and vomiting.  Genitourinary:  Negative for difficulty urinating, dysuria, flank pain and hematuria.  Musculoskeletal:  Negative for arthralgias, back pain, gait problem, joint swelling and myalgias.  Skin:  Negative for color change, pallor, rash and wound.   Neurological:  Negative for dizziness, tremors, weakness and light-headedness.  Hematological:  Negative for adenopathy. Does not bruise/bleed easily.  Psychiatric/Behavioral:  Negative for agitation, behavioral problems, confusion, decreased concentration, dysphoric mood and sleep disturbance.        Objective:   Physical Exam Constitutional:      Appearance: He is well-developed.  HENT:     Head: Normocephalic and atraumatic.  Eyes:     Conjunctiva/sclera: Conjunctivae normal.  Cardiovascular:     Rate and Rhythm: Normal rate and regular rhythm.  Pulmonary:     Effort: Pulmonary effort is normal. No respiratory distress.     Breath sounds: No wheezing.  Abdominal:     General: There is no distension.     Palpations: Abdomen is soft.  Musculoskeletal:        General: No tenderness. Normal range of motion.     Cervical back: Normal range of motion and neck supple.  Skin:    General: Skin is warm and dry.     Coloration: Skin is not pale.     Findings: No erythema or rash.  Neurological:     General: No focal deficit present.     Mental Status: He is alert and oriented to person, place, and time.  Psychiatric:        Mood and Affect: Mood normal.        Behavior: Behavior normal.        Thought Content: Thought content normal.        Judgment: Judgment normal.           Assessment & Plan:    HIV disease:  I have reviewed Darrell [name]'s labs including viral load which was  Lab Results  Component Value Date   HIV1RNAQUANT Not Detected 01/03/2022   and cd4 which was  Lab Results  Component Value Date   CD4TABS 683 01/03/2022     I am continuing patient's prescription for  Dovato--though he is (and I am hopeful he will be eligible to be enrolled into [facility] study)  Regardless he will need to renew RW and HMAP vs PCAP ICAP  Anxiety: He will continue with counseling and also trying to see a psychiatrist.  History of smoking no longer smokes but does  vape nicotine containing products.  COVID-19 infection has recovered from this did not want vaccine today as it is only a month after he had this virus.

## 2022-04-20 DIAGNOSIS — M25511 Pain in right shoulder: Secondary | ICD-10-CM | POA: Diagnosis not present

## 2022-04-20 DIAGNOSIS — Z7409 Other reduced mobility: Secondary | ICD-10-CM | POA: Diagnosis not present

## 2022-04-20 DIAGNOSIS — G8929 Other chronic pain: Secondary | ICD-10-CM | POA: Diagnosis not present

## 2022-04-20 DIAGNOSIS — Z789 Other specified health status: Secondary | ICD-10-CM | POA: Diagnosis not present

## 2022-04-20 DIAGNOSIS — M25611 Stiffness of right shoulder, not elsewhere classified: Secondary | ICD-10-CM | POA: Diagnosis not present

## 2022-04-20 DIAGNOSIS — R531 Weakness: Secondary | ICD-10-CM | POA: Diagnosis not present

## 2022-05-05 DIAGNOSIS — M25511 Pain in right shoulder: Secondary | ICD-10-CM | POA: Diagnosis not present

## 2022-05-05 DIAGNOSIS — G8929 Other chronic pain: Secondary | ICD-10-CM | POA: Diagnosis not present

## 2022-05-05 DIAGNOSIS — Z7409 Other reduced mobility: Secondary | ICD-10-CM | POA: Diagnosis not present

## 2022-05-05 DIAGNOSIS — R531 Weakness: Secondary | ICD-10-CM | POA: Diagnosis not present

## 2022-05-20 ENCOUNTER — Telehealth: Payer: Federal, State, Local not specified - PPO | Admitting: Family

## 2022-05-20 DIAGNOSIS — R197 Diarrhea, unspecified: Secondary | ICD-10-CM

## 2022-05-20 DIAGNOSIS — A084 Viral intestinal infection, unspecified: Secondary | ICD-10-CM | POA: Diagnosis not present

## 2022-05-20 MED ORDER — ONDANSETRON HCL 4 MG PO TABS: 4.0000 mg | ORAL_TABLET | Freq: Three times a day (TID) | ORAL | 0 refills | Status: DC | PRN

## 2022-05-20 NOTE — Progress Notes (Signed)
Virtual Visit Consent   Darrell [name], you are scheduled for a virtual visit with a [facility] provider today. Just as with appointments in the office, your consent must be obtained to participate. Your consent will be active for this visit and any virtual visit you may have with one of our providers in the next 365 days. If you have a MyChart account, a copy of this consent can be sent to you electronically.  As this is a virtual visit, video technology does not allow for your provider to perform a traditional examination. This may limit your provider's ability to fully assess your condition. If your provider identifies any concerns that need to be evaluated in person or the need to arrange testing (such as labs, EKG, etc.), we will make arrangements to do so. Although advances in technology are sophisticated, we cannot ensure that it will always work on either your end or our end. If the connection with a video visit is poor, the visit may have to be switched to a telephone visit. With either a video or telephone visit, we are not always able to ensure that we have a secure connection.  By engaging in this virtual visit, you consent to the provision of healthcare and authorize for your insurance to be billed (if applicable) for the services provided during this visit. Depending on your insurance coverage, you may receive a charge related to this service.  I need to obtain your verbal consent now. Are you willing to proceed with your visit today? DANIEL TIENKEN has provided verbal consent on 05/20/2022 for a virtual visit (video or telephone). Evelina Dun, FNP  Date: 05/20/2022 3:33 PM  Virtual Visit via Video Note   I, Evelina Dun, connected with  LAM HICKEN  ([id number], [birth year]) on 05/20/22 at  3:30 PM EDT by a video-enabled telemedicine application and verified that I am speaking with the correct person using two identifiers.  Location: Patient: Virtual Visit Location Patient:  Home Provider: Virtual Visit Location Provider: Home Office   I discussed the limitations of evaluation and management by telemedicine and the availability of in person appointments. The patient expressed understanding and agreed to proceed.    History of Present Illness: Darrell [name] is a 32 y.o. who identifies as a male who was assigned male at birth, and is being seen today for diarrhea and nausea that started yesterday.  HPI: Diarrhea  This is a new problem. The current episode started yesterday. The problem occurs 5 to 10 times per day. The problem has been unchanged. The stool consistency is described as Watery. Associated symptoms include abdominal pain, bloating, chills, headaches and increased flatus. Pertinent negatives include no fever or vomiting. Associated symptoms comments: Nausea . Nothing aggravates the symptoms. There are no known risk factors. He has tried change of diet (imodium) for the symptoms. The treatment provided mild relief.    Problems:  Patient Active Problem List   Diagnosis Date Noted   Vapes nicotine containing substance 04/17/2022   Anxiety and depression 04/17/2022   COVID-19 04/17/2022   Vaccine counseling 04/17/2022   Chronic right shoulder pain 03/22/2022   HTN (hypertension) 09/05/2018   AKI (acute kidney injury) ([place]) 07/10/2016   Family history of colon cancer 06/21/2016   CRI (chronic renal insufficiency), stage 1 01/11/2016   Encounter for medical examination to establish care 09/09/2014   Elevated blood pressure 05/20/2014   HIV disease ([place]) 10/08/2013   HEADACHE, TENSION 11/09/2006   RHINITIS, ALLERGIC NEC  11/09/2006    Allergies:  Allergies  Allergen Reactions   Sulfonamide Derivatives     unknown   Medications:  Current Outpatient Medications:    ondansetron (ZOFRAN) 4 MG tablet, Take 1 tablet (4 mg total) by mouth every 8 (eight) hours as needed for nausea or vomiting., Disp: 20 tablet, Rfl: 0   dolutegravir-lamiVUDine  (DOVATO) 50-300 MG tablet, Take 1 tablet by mouth daily., Disp: 30 tablet, Rfl: 11   Multiple Vitamin (MULTI-VITAMINS) TABS, Take by mouth., Disp: , Rfl:   Observations/Objective: Patient is well-developed, well-nourished in no acute distress.  Resting comfortably  at home.  Head is normocephalic, atraumatic.  No labored breathing.  Speech is clear and coherent with logical content.  Patient is alert and oriented at baseline.    Assessment and Plan: 1. Diarrhea, unspecified type - ondansetron (ZOFRAN) 4 MG tablet; Take 1 tablet (4 mg total) by mouth every 8 (eight) hours as needed for nausea or vomiting.  Dispense: 20 tablet; Refill: 0  2. Viral gastroenteritis - ondansetron (ZOFRAN) 4 MG tablet; Take 1 tablet (4 mg total) by mouth every 8 (eight) hours as needed for nausea or vomiting.  Dispense: 20 tablet; Refill: 0  Bland diet Force fluids Tylenol Zofran as needed  Follow up if symptoms worsen or do not improve   Follow Up Instructions: I discussed the assessment and treatment plan with the patient. The patient was provided an opportunity to ask questions and all were answered. The patient agreed with the plan and demonstrated an understanding of the instructions.  A copy of instructions were sent to the patient via MyChart unless otherwise noted below.    The patient was advised to call back or seek an in-person evaluation if the symptoms worsen or if the condition fails to improve as anticipated.  Time:  I spent 9 minutes with the patient via telehealth technology discussing the above problems/concerns.    Evelina Dun, FNP

## 2022-05-26 DIAGNOSIS — G8929 Other chronic pain: Secondary | ICD-10-CM | POA: Diagnosis not present

## 2022-05-26 DIAGNOSIS — Z7409 Other reduced mobility: Secondary | ICD-10-CM | POA: Diagnosis not present

## 2022-05-26 DIAGNOSIS — R531 Weakness: Secondary | ICD-10-CM | POA: Diagnosis not present

## 2022-05-26 DIAGNOSIS — M25511 Pain in right shoulder: Secondary | ICD-10-CM | POA: Diagnosis not present

## 2022-06-07 ENCOUNTER — Telehealth: Payer: Federal, State, Local not specified - PPO | Admitting: Nurse Practitioner

## 2022-06-07 DIAGNOSIS — G4489 Other headache syndrome: Secondary | ICD-10-CM

## 2022-06-07 DIAGNOSIS — R197 Diarrhea, unspecified: Secondary | ICD-10-CM | POA: Diagnosis not present

## 2022-06-07 NOTE — Progress Notes (Signed)
Virtual Visit Consent   Darrell [name], you are scheduled for a virtual visit with a [facility] provider today. Just as with appointments in the office, your consent must be obtained to participate. Your consent will be active for this visit and any virtual visit you may have with one of our providers in the next 365 days. If you have a MyChart account, a copy of this consent can be sent to you electronically.  As this is a virtual visit, video technology does not allow for your provider to perform a traditional examination. This may limit your provider's ability to fully assess your condition. If your provider identifies any concerns that need to be evaluated in person or the need to arrange testing (such as labs, EKG, etc.), we will make arrangements to do so. Although advances in technology are sophisticated, we cannot ensure that it will always work on either your end or our end. If the connection with a video visit is poor, the visit may have to be switched to a telephone visit. With either a video or telephone visit, we are not always able to ensure that we have a secure connection.  By engaging in this virtual visit, you consent to the provision of healthcare and authorize for your insurance to be billed (if applicable) for the services provided during this visit. Depending on your insurance coverage, you may receive a charge related to this service.  I need to obtain your verbal consent now. Are you willing to proceed with your visit today? KAMRAN COKER has provided verbal consent on 06/07/2022 for a virtual visit (video or telephone). Viviano Simas, FNP  Date: 06/07/2022 2:27 PM  Virtual Visit via Video Note   I, Viviano Simas, connected with  Darrell [name]  ([id number], 10-Sep-1990) on 06/07/22 at  2:30 PM EDT by a video-enabled telemedicine application and verified that I am speaking with the correct person using two identifiers.  Location: Patient: Virtual Visit Location Patient:  Home Provider: Virtual Visit Location Provider: Home Office   I discussed the limitations of evaluation and management by telemedicine and the availability of in person appointments. The patient expressed understanding and agreed to proceed.    History of Present Illness: Darrell [name] is a 32 y.o. who identifies as a male who was assigned male at birth, and is being seen today for headaches and diarrhea.  He has had these recurrent symptoms and has talked to his doctor about depression and stress induced episodes of headaches and diarrhea   History as below   Denies any fever, slight abdominal discomfort   Headache is frontal  No vomiting   He is in the process of seeking a counselor for mental health needs Denies SI today    Problems:  Patient Active Problem List   Diagnosis Date Noted   Vapes nicotine containing substance 04/17/2022   Anxiety and depression 04/17/2022   COVID-19 04/17/2022   Vaccine counseling 04/17/2022   Chronic right shoulder pain 03/22/2022   HTN (hypertension) 09/05/2018   AKI (acute kidney injury) 07/10/2016   Family history of colon cancer 06/21/2016   CRI (chronic renal insufficiency), stage 1 01/11/2016   Encounter for medical examination to establish care 09/09/2014   Elevated blood pressure 05/20/2014   HIV disease 10/08/2013   HEADACHE, TENSION 11/09/2006   RHINITIS, ALLERGIC NEC 11/09/2006    Allergies:  Allergies  Allergen Reactions   Sulfonamide Derivatives     unknown   Medications:  Current Outpatient Medications:  dolutegravir-lamiVUDine (DOVATO) 50-300 MG tablet, Take 1 tablet by mouth daily., Disp: 30 tablet, Rfl: 11   Multiple Vitamin (MULTI-VITAMINS) TABS, Take by mouth., Disp: , Rfl:    ondansetron (ZOFRAN) 4 MG tablet, Take 1 tablet (4 mg total) by mouth every 8 (eight) hours as needed for nausea or vomiting., Disp: 20 tablet, Rfl: 0  Observations/Objective: Patient is well-developed, well-nourished in no acute  distress.  Resting comfortably  at home.  Head is normocephalic, atraumatic.  No labored breathing.  Speech is clear and coherent with logical content.  Patient is alert and oriented at baseline.    Assessment and Plan: 1. Other headache syndrome Continue tylenol for relief   2. Diarrhea, unspecified type BRAT diet  Push fluids     Rest today, continue to seek counseling and follow up with PCP  [facility] for behavioral health sent via Mychart for added resource   Follow Up Instructions: I discussed the assessment and treatment plan with the patient. The patient was provided an opportunity to ask questions and all were answered. The patient agreed with the plan and demonstrated an understanding of the instructions.  A copy of instructions were sent to the patient via MyChart unless otherwise noted below.    The patient was advised to call back or seek an in-person evaluation if the symptoms worsen or if the condition fails to improve as anticipated.  Time:  I spent 15 minutes with the patient via telehealth technology discussing the above problems/concerns.    Viviano Simas, FNP

## 2022-06-07 NOTE — Patient Instructions (Signed)
[email or web address]

## 2022-06-27 ENCOUNTER — Telehealth: Payer: Federal, State, Local not specified - PPO | Admitting: Family Medicine

## 2022-06-27 DIAGNOSIS — A059 Bacterial foodborne intoxication, unspecified: Secondary | ICD-10-CM | POA: Diagnosis not present

## 2022-06-27 NOTE — Progress Notes (Signed)
Virtual Visit Consent   Darrell [name], you are scheduled for a virtual visit with a [facility] provider today. Just as with appointments in the office, your consent must be obtained to participate. Your consent will be active for this visit and any virtual visit you may have with one of our providers in the next 365 days. If you have a MyChart account, a copy of this consent can be sent to you electronically.  As this is a virtual visit, video technology does not allow for your provider to perform a traditional examination. This may limit your provider's ability to fully assess your condition. If your provider identifies any concerns that need to be evaluated in person or the need to arrange testing (such as labs, EKG, etc.), we will make arrangements to do so. Although advances in technology are sophisticated, we cannot ensure that it will always work on either your end or our end. If the connection with a video visit is poor, the visit may have to be switched to a telephone visit. With either a video or telephone visit, we are not always able to ensure that we have a secure connection.  By engaging in this virtual visit, you consent to the provision of healthcare and authorize for your insurance to be billed (if applicable) for the services provided during this visit. Depending on your insurance coverage, you may receive a charge related to this service.  I need to obtain your verbal consent now. Are you willing to proceed with your visit today? [name] has provided verbal consent on 06/27/2022 for a virtual visit (video or telephone). Freddy Finner, NP  Date: 06/27/2022 11:46 AM  Virtual Visit via Video Note   I, Freddy Finner, connected with  Darrell [name]  ([id number], April 15, 1990) on 06/27/22 at 11:45 AM EDT by a video-enabled telemedicine application and verified that I am speaking with the correct person using two identifiers.  Location: Patient: Virtual Visit Location Patient:  Home Provider: Virtual Visit Location Provider: Home Office   I discussed the limitations of evaluation and management by telemedicine and the availability of in person appointments. The patient expressed understanding and agreed to proceed.    History of Present Illness: Darrell [name] is a 32 y.o. who identifies as a male who was assigned male at birth, and is being seen today for food poisoning.  Onset was yesterday evening after chinese meal- woke overnight with vomiting with 3 hours of meal Associated symptoms are vomiting, milky taste in mouth, nauseas with chills, lower energy, dizziness -mild Modifying factors are has not tried anything  Denies chest pain, shortness of breath, fevers  Exposure to sick contacts-known   Problems:  Patient Active Problem List   Diagnosis Date Noted   Vapes nicotine containing substance 04/17/2022   Anxiety and depression 04/17/2022   COVID-19 04/17/2022   Vaccine counseling 04/17/2022   Chronic right shoulder pain 03/22/2022   HTN (hypertension) 09/05/2018   AKI (acute kidney injury) ([facility]) 07/10/2016   Family history of colon cancer 06/21/2016   CRI (chronic renal insufficiency), stage 1 01/11/2016   Encounter for medical examination to establish care 09/09/2014   Elevated blood pressure 05/20/2014   HIV disease ([facility]) 10/08/2013   HEADACHE, TENSION 11/09/2006   RHINITIS, ALLERGIC NEC 11/09/2006    Allergies:  Allergies  Allergen Reactions   Sulfonamide Derivatives     unknown   Medications:  Current Outpatient Medications:    dolutegravir-lamiVUDine (DOVATO) 50-300 MG tablet, Take  1 tablet by mouth daily., Disp: 30 tablet, Rfl: 11   Multiple Vitamin (MULTI-VITAMINS) TABS, Take by mouth., Disp: , Rfl:    ondansetron (ZOFRAN) 4 MG tablet, Take 1 tablet (4 mg total) by mouth every 8 (eight) hours as needed for nausea or vomiting., Disp: 20 tablet, Rfl: 0  Observations/Objective: Patient is well-developed, well-nourished in no acute  distress.  Resting comfortably  at home.  Head is normocephalic, atraumatic.  No labored breathing.  Speech is clear and coherent with logical content.  Patient is alert and oriented at baseline.    Assessment and Plan:  1. Food poisoning  -use zofran and pepto as needed (encouraged to wait a little bit so that everything can get out of his system) -work note today- might need tomorrow will call back if so -bland diet for 48-72 hours  -follow up in person if unable to get it under control   Reviewed side effects, risks and benefits of medication.    Patient acknowledged agreement and understanding of the plan.   Past Medical, Surgical, Social History, Allergies, and Medications have been Reviewed.     Follow Up Instructions: I discussed the assessment and treatment plan with the patient. The patient was provided an opportunity to ask questions and all were answered. The patient agreed with the plan and demonstrated an understanding of the instructions.  A copy of instructions were sent to the patient via MyChart unless otherwise noted below.    The patient was advised to call back or seek an in-person evaluation if the symptoms worsen or if the condition fails to improve as anticipated.  Time:  I spent 10 minutes with the patient via telehealth technology discussing the above problems/concerns.    Freddy Finner, NP

## 2022-06-27 NOTE — Patient Instructions (Addendum)
Darrell [name], thank you for joining Freddy Finner, NP for today's virtual visit.  While this provider is not your primary care provider (PCP), if your PCP is located in our provider database this encounter information will be shared with them immediately following your visit.   A [facility] MyChart account gives you access to today's visit and all your visits, tests, and labs performed at [facility] " click here if you don't have a [facility] MyChart account or go to [email or web address]  Consent: (Patient) Darrell [name] provided verbal consent for this virtual visit at the beginning of the encounter.  Current Medications:  Current Outpatient Medications:    dolutegravir-lamiVUDine (DOVATO) 50-300 MG tablet, Take 1 tablet by mouth daily., Disp: 30 tablet, Rfl: 11   Multiple Vitamin (MULTI-VITAMINS) TABS, Take by mouth., Disp: , Rfl:    ondansetron (ZOFRAN) 4 MG tablet, Take 1 tablet (4 mg total) by mouth every 8 (eight) hours as needed for nausea or vomiting., Disp: 20 tablet, Rfl: 0   Medications ordered in this encounter:  No orders of the defined types were placed in this encounter.    *If you need refills on other medications prior to your next appointment, please contact your pharmacy*  Follow-Up: Call back or seek an in-person evaluation if the symptoms worsen or if the condition fails to improve as anticipated.  [facility] [phone]  Other Instructions  Food Poisoning Food poisoning is caused by eating or drinking foods or drinks that are spoiled. In most cases, food poisoning is mild and lasts 2-7 days. However, some cases can be serious, especially for people who have a weak body defense system (immune system), like older people, children and babies, and pregnant women. What are the causes? This condition is caused by eating foods that contain viruses, bacteria, parasites, or mold. This can happen by: Not washing your hands  well enough or often enough. Not storing food properly. Preparing, serving, and storing food on surfaces that are not clean. Cooking or eating with utensils that are not clean. Not cooking food to the right temperature. Viruses, bacteria, or parasites can hurt the intestine. This often causes very bad watery poop (diarrhea). The most common causes of this condition are: Viruses, such as: Norovirus. Rotavirus. Bacteria, such as: Salmonella. Listeria. E. coli (Escherichia coli). Parasites, such as: Giardia. Toxoplasma gondii. What are the signs or symptoms? Feeling like you may vomit (nauseous). Vomiting. Cramping or belly pain. Watery poop. Fever. Chills. Muscle aches. Not having enough water in the body (dehydration). How is this treated? Making sure that you get enough to drink. Taking medicines. In very bad cases, you may need: To be treated in a hospital. To get fluids through an IV tube. Follow these instructions at home: Eating and drinking  Drink enough fluids to keep your pee (urine) pale yellow. You may need to drink small amounts of clear liquids often. Avoid: Milk. Caffeine. Alcohol. Ask your doctor how you should get enough fluid in your body. Eat small meals often instead of eating large meals. Medicines Take over-the-counter and prescription medicines only as told by your doctor. Ask your doctor if you should keep taking any of your regular medicines. If you were prescribed an antibiotic medicine, take it as told by your doctor. Do not stop taking the antibiotic even if you start to feel better. General instructions  Wash your hands with soap and water for at least 20 seconds: Before you prepare food. After you  go to the bathroom (use the toilet). Make sure that the people who live with you also wash their hands often. Rest at home until you feel better. Clean surfaces that you touch with a product that contains chlorine bleach. Keep all follow-up  visits. How is this prevented? Before and after you handle raw foods: Wash your hands. Wash surfaces used to prepare the food. Wash utensils. Do not prepare or store raw meat in the same place you prepare or store fresh fruits and vegetables. Keep refrigerated foods colder than 12F (5C). Serve hot foods right away or keep them heated above 112F (60C). Store dry foods in cool, dry spaces. Keep them away from too much heat or moisture. Throw out any foods that: Do not smell right. Are in cans that are bulging. Heat canned foods fully before you taste them. Drink bottled or germ-free (sterile) water when you travel. Get help right away if: You have trouble: Breathing. Swallowing. Talking. Moving. You have blurry vision. You faint. The whites of your eyes turn yellow (jaundice). You cannot eat or drink without vomiting. You continue to vomit or have watery poop. You start to have pain in your belly, your belly pain gets worse, or your belly pain stays in one small area. You have a fever. You have blood or mucus in your poop (stools), or your poop looks dark black and tarry. You have signs of not having enough water in your body, such as: Dark pee, very little pee, or no pee. Not making tears while crying. Dry mouth or lips. Sunken eyes. Being sleepy. Feeling weak. Being dizzy. These symptoms may be an emergency. Get help right away. Call 911. Do not wait to see if the symptoms will go away. Do not drive yourself to the hospital. Summary Food poisoning is an illness that is caused by eating or drinking spoiled foods or drinks. Symptoms may include feeling like you may vomit, vomiting, and having watery poop. In most cases, the illness will get better on its own in 2-7 days. In very bad cases, you may need to stay at the hospital. This information is not intended to replace advice given to you by your health care provider. Make sure you discuss any questions you have with  your health care provider. Document Revised: 12/27/2020 Document Reviewed: 12/27/2020 Elsevier Patient Education  2023 Elsevier Inc.     If you have been instructed to have an in-person evaluation today at a local Urgent Care facility, please use the link below. It will take you to a list of all of our available [facility], including address, phone number and hours of operation. Please do not delay care.  [facility]  If you or a family member do not have a primary care provider, use the link below to schedule a visit and establish care. When you choose a [facility] primary care physician or advanced practice provider, you gain a long-term partner in health. Find a Primary Care Provider  Learn more about [facility]'s in-office and virtual care options: [facility] - Get Care Now

## 2022-06-28 ENCOUNTER — Encounter: Payer: Self-pay | Admitting: Family Medicine

## 2022-06-29 DIAGNOSIS — M25511 Pain in right shoulder: Secondary | ICD-10-CM | POA: Diagnosis not present

## 2022-06-29 DIAGNOSIS — M25611 Stiffness of right shoulder, not elsewhere classified: Secondary | ICD-10-CM | POA: Diagnosis not present

## 2022-06-29 DIAGNOSIS — G8929 Other chronic pain: Secondary | ICD-10-CM | POA: Diagnosis not present

## 2022-06-29 DIAGNOSIS — W19XXXD Unspecified fall, subsequent encounter: Secondary | ICD-10-CM | POA: Diagnosis not present

## 2022-07-14 DIAGNOSIS — G8929 Other chronic pain: Secondary | ICD-10-CM | POA: Diagnosis not present

## 2022-07-14 DIAGNOSIS — W19XXXD Unspecified fall, subsequent encounter: Secondary | ICD-10-CM | POA: Diagnosis not present

## 2022-07-14 DIAGNOSIS — M25511 Pain in right shoulder: Secondary | ICD-10-CM | POA: Diagnosis not present

## 2022-07-14 DIAGNOSIS — M25611 Stiffness of right shoulder, not elsewhere classified: Secondary | ICD-10-CM | POA: Diagnosis not present

## 2022-08-11 DIAGNOSIS — M25511 Pain in right shoulder: Secondary | ICD-10-CM | POA: Diagnosis not present

## 2022-08-11 DIAGNOSIS — M25611 Stiffness of right shoulder, not elsewhere classified: Secondary | ICD-10-CM | POA: Diagnosis not present

## 2022-08-11 DIAGNOSIS — S43431A Superior glenoid labrum lesion of right shoulder, initial encounter: Secondary | ICD-10-CM | POA: Diagnosis not present

## 2022-08-11 DIAGNOSIS — W19XXXD Unspecified fall, subsequent encounter: Secondary | ICD-10-CM | POA: Diagnosis not present

## 2022-08-14 ENCOUNTER — Telehealth: Payer: Self-pay

## 2022-08-14 NOTE — Telephone Encounter (Signed)
Patient called wanting to know if he had ever been tested for HSV. He is concerned because his partner of two years just tested positive for HSV-1. We discussed the high prevalence of HSV and how it can often be asymptomatic.   He is asking if he needs testing or treatment. Discussed that since he's never had symptoms of an outbreak or cold sore that testing would not change his plan of care and that medication is typically used on an as needed basis when symptoms arise.   Patient verbalized understanding and has no further questions.   Sandie Ano, RN

## 2022-09-25 ENCOUNTER — Ambulatory Visit: Payer: Federal, State, Local not specified - PPO | Admitting: Infectious Disease

## 2022-09-25 ENCOUNTER — Ambulatory Visit: Payer: Federal, State, Local not specified - PPO

## 2022-09-26 ENCOUNTER — Telehealth: Payer: Federal, State, Local not specified - PPO | Admitting: Family Medicine

## 2022-09-26 DIAGNOSIS — R110 Nausea: Secondary | ICD-10-CM | POA: Diagnosis not present

## 2022-09-26 MED ORDER — ONDANSETRON 4 MG PO TBDP: 4.0000 mg | ORAL_TABLET | Freq: Three times a day (TID) | ORAL | 0 refills | Status: DC | PRN

## 2022-09-26 NOTE — Patient Instructions (Addendum)
  [name], thank you for joining Freddy Finner, NP for today's virtual visit.  While this provider is not your primary care provider (PCP), if your PCP is located in our provider database this encounter information will be shared with them immediately following your visit.   A [facility] MyChart account gives you access to today's visit and all your visits, tests, and labs performed at [facility] " click here if you don't have a [facility] MyChart account or go to [email or web address]  Consent: (Patient) [name] provided verbal consent for this virtual visit at the beginning of the encounter.  Current Medications:  Current Outpatient Medications:    ondansetron (ZOFRAN-ODT) 4 MG disintegrating tablet, Take 1 tablet (4 mg total) by mouth every 8 (eight) hours as needed for nausea or vomiting., Disp: 20 tablet, Rfl: 0   dolutegravir-lamiVUDine (DOVATO) 50-300 MG tablet, Take 1 tablet by mouth daily., Disp: 30 tablet, Rfl: 11   Multiple Vitamin (MULTI-VITAMINS) TABS, Take by mouth., Disp: , Rfl:    Medications ordered in this encounter:  Meds ordered this encounter  Medications   ondansetron (ZOFRAN-ODT) 4 MG disintegrating tablet    Sig: Take 1 tablet (4 mg total) by mouth every 8 (eight) hours as needed for nausea or vomiting.    Dispense:  20 tablet    Refill:  0    Order Specific Question:   Supervising Provider    Answer:   Merrilee Jansky [id number]     *If you need refills on other medications prior to your next appointment, please contact your pharmacy*  Follow-Up: Call back or seek an in-person evaluation if the symptoms worsen or if the condition fails to improve as anticipated.  [facility] [phone]  Other Instructions use zofran and pepto as needed -work note today- -bland diet for 48-72 hours  -follow up in person if unable to get it under control   If you have been instructed to have an in-person evaluation  today at a local Urgent Care facility, please use the link below. It will take you to a list of all of our available [facility], including address, phone number and hours of operation. Please do not delay care.  [facility]  If you or a family member do not have a primary care provider, use the link below to schedule a visit and establish care. When you choose a [facility] primary care physician or advanced practice provider, you gain a long-term partner in health. Find a Primary Care Provider  Learn more about [facility]'s in-office and virtual care options: [facility] - Get Care Now

## 2022-09-26 NOTE — Progress Notes (Signed)
Virtual Visit Consent   [name], you are scheduled for a virtual visit with a [facility] provider today. Just as with appointments in the office, your consent must be obtained to participate. Your consent will be active for this visit and any virtual visit you may have with one of our providers in the next 365 days. If you have a MyChart account, a copy of this consent can be sent to you electronically.  As this is a virtual visit, video technology does not allow for your provider to perform a traditional examination. This may limit your provider's ability to fully assess your condition. If your provider identifies any concerns that need to be evaluated in person or the need to arrange testing (such as labs, EKG, etc.), we will make arrangements to do so. Although advances in technology are sophisticated, we cannot ensure that it will always work on either your end or our end. If the connection with a video visit is poor, the visit may have to be switched to a telephone visit. With either a video or telephone visit, we are not always able to ensure that we have a secure connection.  By engaging in this virtual visit, you consent to the provision of healthcare and authorize for your insurance to be billed (if applicable) for the services provided during this visit. Depending on your insurance coverage, you may receive a charge related to this service.  I need to obtain your verbal consent now. Are you willing to proceed with your visit today? GREGORIE BEHLER has provided verbal consent on 09/26/2022 for a virtual visit (video or telephone). Freddy Finner, NP  Date: 09/26/2022 2:49 PM  Virtual Visit via Video Note   I, Freddy [name], connected with  Darrell [name]  ([id number], [birth year]) on 09/26/22 at  2:45 PM EDT by a video-enabled telemedicine application and verified that I am speaking with the correct person using two identifiers.  Location: Patient: Virtual Visit Location Patient:  Home Provider: Virtual Visit Location Provider: Home Office   I discussed the limitations of evaluation and management by telemedicine and the availability of in person appointments. The patient expressed understanding and agreed to proceed.    History of Present Illness: Darrell [name] is a 32 y.o. who identifies as a male who was assigned male at birth, and is being seen today for food poisoning.  Onset was this morning with diarrhea- that woke him up. Reports making burgers last night, thought they were cooked all the way- but has not been able to tolerate anything since. Associated symptoms are nausea Modifying factors are pepto and Gatorade  Denies chest pain, shortness of breath, fevers, chills  Problems:  Patient Active Problem List   Diagnosis Date Noted   Vapes nicotine containing substance 04/17/2022   Anxiety and depression 04/17/2022   COVID-19 04/17/2022   Vaccine counseling 04/17/2022   Chronic right shoulder pain 03/22/2022   HTN (hypertension) 09/05/2018   AKI (acute kidney injury) (HCC) 07/10/2016   Family history of colon cancer 06/21/2016   CRI (chronic renal insufficiency), stage 1 01/11/2016   Encounter for medical examination to establish care 09/09/2014   Elevated blood pressure 05/20/2014   HIV disease (HCC) 10/08/2013   HEADACHE, TENSION 11/09/2006   RHINITIS, ALLERGIC NEC 11/09/2006    Allergies:  Allergies  Allergen Reactions   Sulfonamide Derivatives     unknown   Medications:  Current Outpatient Medications:    dolutegravir-lamiVUDine (DOVATO) 50-300 MG tablet, Take 1  tablet by mouth daily., Disp: 30 tablet, Rfl: 11   Multiple Vitamin (MULTI-VITAMINS) TABS, Take by mouth., Disp: , Rfl:    ondansetron (ZOFRAN) 4 MG tablet, Take 1 tablet (4 mg total) by mouth every 8 (eight) hours as needed for nausea or vomiting., Disp: 20 tablet, Rfl: 0  Observations/Objective: Patient is well-developed, well-nourished in no acute distress.  Resting  comfortably  at home.  Head is normocephalic, atraumatic.  No labored breathing.  Speech is clear and coherent with logical content.  Patient is alert and oriented at baseline.    Assessment and Plan:  1. Nausea  - ondansetron (ZOFRAN-ODT) 4 MG disintegrating tablet; Take 1 tablet (4 mg total) by mouth every 8 (eight) hours as needed for nausea or vomiting.  Dispense: 20 tablet; Refill: 0  use zofran and pepto as needed -work note today- -bland diet for 48-72 hours  -follow up in person if unable to get it under control  Reviewed side effects, risks and benefits of medication.    Patient acknowledged agreement and understanding of the plan.   Past Medical, Surgical, Social History, Allergies, and Medications have been Reviewed.    Follow Up Instructions: I discussed the assessment and treatment plan with the patient. The patient was provided an opportunity to ask questions and all were answered. The patient agreed with the plan and demonstrated an understanding of the instructions.  A copy of instructions were sent to the patient via MyChart unless otherwise noted below.    The patient was advised to call back or seek an in-person evaluation if the symptoms worsen or if the condition fails to improve as anticipated.  Time:  I spent 10 minutes with the patient via telehealth technology discussing the above problems/concerns.    Freddy Finner, NP

## 2022-10-10 ENCOUNTER — Encounter: Payer: Self-pay | Admitting: Infectious Disease

## 2022-10-10 ENCOUNTER — Other Ambulatory Visit (HOSPITAL_COMMUNITY)
Admission: RE | Admit: 2022-10-10 | Discharge: 2022-10-10 | Disposition: A | Discharge status: Home / Self Care | Payer: Federal, State, Local not specified - PPO | Source: Ambulatory Visit | Attending: Infectious Disease | Admitting: Infectious Disease

## 2022-10-10 ENCOUNTER — Other Ambulatory Visit: Payer: Self-pay

## 2022-10-10 ENCOUNTER — Ambulatory Visit: Payer: Federal, State, Local not specified - PPO

## 2022-10-10 ENCOUNTER — Ambulatory Visit (INDEPENDENT_AMBULATORY_CARE_PROVIDER_SITE_OTHER): Payer: Federal, State, Local not specified - PPO | Admitting: Infectious Disease

## 2022-10-10 VITALS — BP 118/83 | HR 68 | Temp 98.1°F | Wt 188.0 lb

## 2022-10-10 DIAGNOSIS — Z113 Encounter for screening for infections with a predominantly sexual mode of transmission: Secondary | ICD-10-CM

## 2022-10-10 DIAGNOSIS — B20 Human immunodeficiency virus [HIV] disease: Secondary | ICD-10-CM

## 2022-10-10 MED ORDER — DOVATO 50-300 MG PO TABS
1.0000 | ORAL_TABLET | Freq: Every day | ORAL | 11 refills | Status: DC
Start: 2022-10-10 — End: 2023-05-14

## 2022-10-10 NOTE — Progress Notes (Signed)
Subjective:   Complaint follow-up for HIV disease on medications   Patient ID: Darrell [name], male    DOB: 03-Mar-1990, 32 y.o.   MRN: [id number]  HPI  Darrell [name] is a 32 year old man with HIV disease that is perfect controlled on Dovato.  He was interested in bracing we screened for the study but unfortunately was not eligible.  He continues to be highly adherent to his Dovato.  He is interested in [place] we had extensive discussion about [place] today.    Past Medical History:  Diagnosis Date   AKI (acute kidney injury) (HCC) 07/10/2016   Anxiety and depression 04/17/2022   COVID-19 04/17/2022   HIV infection (HCC)    HTN (hypertension) 09/05/2018   Screen for STD (sexually transmitted disease) 09/09/2014   Smoker 11/23/2014   Vaccine counseling 04/17/2022   Vapes nicotine containing substance 04/17/2022    No past surgical history on file.  Family History  Problem Relation Age of Onset   Cancer Mother    Fibromyalgia Mother    Hypertension Mother       Social History   Socioeconomic History   Marital status: Single    Spouse name: Not on file   Number of children: Not on file   Years of education: Not on file   Highest education level: Not on file  Occupational History   Not on file  Tobacco Use   Smoking status: Light Smoker    Types: E-cigarettes   Smokeless tobacco: Never   Tobacco comments:    Daily  Substance and Sexual Activity   Alcohol use: Yes    Comment: socially   Drug use: Yes    Frequency: 7.0 times per week    Types: Marijuana    Comment: socially   Sexual activity: Yes    Partners: Male    Birth control/protection: Condom    Comment: declined condoms  Other Topics Concern   Not on file  Social History Narrative   Not on file   Social Determinants of Health   Financial Resource Strain: Not on file  Food Insecurity: Not on file  Transportation Needs: Not on file  Physical Activity: Not on file  Stress: Not on file  Social  Connections: Not on file    Allergies  Allergen Reactions   Sulfonamide Derivatives     unknown     Current Outpatient Medications:    Multiple Vitamin (MULTI-VITAMINS) TABS, Take by mouth., Disp: , Rfl:    ondansetron (ZOFRAN-ODT) 4 MG disintegrating tablet, Take 1 tablet (4 mg total) by mouth every 8 (eight) hours as needed for nausea or vomiting., Disp: 20 tablet, Rfl: 0   dolutegravir-lamiVUDine (DOVATO) 50-300 MG tablet, Take 1 tablet by mouth daily., Disp: 30 tablet, Rfl: 11    Review of Systems  Constitutional:  Negative for activity change, appetite change, chills, diaphoresis, fatigue, fever and unexpected weight change.  HENT:  Negative for congestion, rhinorrhea, sinus pressure, sneezing, sore throat and trouble swallowing.   Eyes:  Negative for photophobia and visual disturbance.  Respiratory:  Negative for cough, chest tightness, shortness of breath, wheezing and stridor.   Cardiovascular:  Negative for chest pain, palpitations and leg swelling.  Gastrointestinal:  Negative for abdominal distention, abdominal pain, anal bleeding, blood in stool, constipation, diarrhea, nausea and vomiting.  Genitourinary:  Negative for difficulty urinating, dysuria, flank pain and hematuria.  Musculoskeletal:  Negative for arthralgias, back pain, gait problem, joint swelling and myalgias.  Skin:  Negative for color  change, pallor, rash and wound.  Neurological:  Negative for dizziness, tremors, weakness and light-headedness.  Hematological:  Negative for adenopathy. Does not bruise/bleed easily.  Psychiatric/Behavioral:  Negative for agitation, behavioral problems, confusion, decreased concentration, dysphoric mood and sleep disturbance.        Objective:   Physical Exam Constitutional:      Appearance: He is well-developed.  HENT:     Head: Normocephalic and atraumatic.  Eyes:     Conjunctiva/sclera: Conjunctivae normal.  Cardiovascular:     Rate and Rhythm: Normal rate and  regular rhythm.  Pulmonary:     Effort: Pulmonary effort is normal. No respiratory distress.     Breath sounds: No wheezing.  Abdominal:     General: There is no distension.     Palpations: Abdomen is soft.  Musculoskeletal:        General: No tenderness. Normal range of motion.     Cervical back: Normal range of motion and neck supple.  Skin:    General: Skin is warm and dry.     Coloration: Skin is not pale.     Findings: No erythema or rash.  Neurological:     General: No focal deficit present.     Mental Status: He is alert and oriented to person, place, and time.  Psychiatric:        Mood and Affect: Mood normal.        Behavior: Behavior normal.        Thought Content: Thought content normal.        Judgment: Judgment normal.           Assessment & Plan:   HIV disease:  I will add order HIV viral load CD4 count CBC with differential CMP, RPR GC and chlamydia and I will continue  Darrell [name]'s  Dovat  prescription   We did have an extensive discussion with regards to [place].  I highlighted the 1% virological failure rate despite on-time injection and the other ways that people can develop resistance in particular if the doses are missed.  He would like to hold off for now and still think about he does have a good friend who just darted on 3 months ago.    STI screening we will screen for gonorrhea and chlamydia and oropharynx urine and rectum.  Anal cancer prevention will obtain anal Pap smear

## 2022-10-11 LAB — CYTOLOGY, (ORAL, ANAL, URETHRAL) ANCILLARY ONLY
Chlamydia: NEGATIVE
Chlamydia: NEGATIVE
Comment: NEGATIVE
Comment: NEGATIVE
Comment: NORMAL
Comment: NORMAL
Neisseria Gonorrhea: NEGATIVE
Neisseria Gonorrhea: NEGATIVE

## 2022-10-11 LAB — URINE CYTOLOGY ANCILLARY ONLY
Chlamydia: NEGATIVE
Comment: NEGATIVE
Comment: NORMAL
Neisseria Gonorrhea: NEGATIVE

## 2022-10-11 LAB — T-HELPER CELLS (CD4) COUNT (NOT AT ARMC)
CD4 % Helper T Cell: 38 % (ref 33–65)
CD4 T Cell Abs: 834 /uL (ref 400–1790)

## 2022-10-12 LAB — LIPID PANEL
Cholesterol: 160 mg/dL (ref <200)
HDL: 53 mg/dL (ref >=40)
LDL Cholesterol (Calc): 91 mg/dL
Non-HDL Cholesterol (Calc): 107 mg/dL (ref <130)
Total CHOL/HDL Ratio: 3 (calc) (ref <5.0)
Triglycerides: 69 mg/dL (ref <150)

## 2022-10-12 LAB — COMPLETE METABOLIC PANEL WITH GFR
AG Ratio: 1.6 (calc) (ref 1.0–2.5)
ALT: 15 U/L (ref 9–46)
AST: 18 U/L (ref 10–40)
Albumin: 4.4 g/dL (ref 3.6–5.1)
Alkaline phosphatase (APISO): 43 U/L (ref 36–130)
BUN/Creatinine Ratio: 8 (calc) (ref 6–22)
BUN: 11 mg/dL (ref 7–25)
CO2: 29 mmol/L (ref 20–32)
Calcium: 9.7 mg/dL (ref 8.6–10.3)
Chloride: 104 mmol/L (ref 98–110)
Creat: 1.42 mg/dL — ABNORMAL HIGH (ref 0.60–1.26)
Globulin: 2.7 g/dL (ref 1.9–3.7)
Glucose, Bld: 89 mg/dL (ref 65–99)
Potassium: 4.2 mmol/L (ref 3.5–5.3)
Sodium: 140 mmol/L (ref 135–146)
Total Bilirubin: 1 mg/dL (ref 0.2–1.2)
Total Protein: 7.1 g/dL (ref 6.1–8.1)
eGFR: 66 mL/min/{1.73_m2} (ref >=60)

## 2022-10-12 LAB — CBC WITH DIFFERENTIAL/PLATELET
Absolute Monocytes: 349 {cells}/uL (ref 200–950)
Basophils Absolute: 29 {cells}/uL (ref 0–200)
Basophils Relative: 0.8 %
Eosinophils Absolute: 50 {cells}/uL (ref 15–500)
Eosinophils Relative: 1.4 %
HCT: 44.8 % (ref 38.5–50.0)
Hemoglobin: 15.5 g/dL (ref 13.2–17.1)
Lymphs Abs: 2138 {cells}/uL (ref 850–3900)
MCH: 31 pg (ref 27.0–33.0)
MCHC: 34.6 g/dL (ref 32.0–36.0)
MCV: 89.6 fL (ref 80.0–100.0)
MPV: 8.8 fL (ref 7.5–12.5)
Monocytes Relative: 9.7 %
Neutro Abs: 1033 cells/uL — ABNORMAL LOW (ref 1500–7800)
Neutrophils Relative %: 28.7 %
Platelets: 297 10*3/uL (ref 140–400)
RBC: 5 10*6/uL (ref 4.20–5.80)
RDW: 12.8 % (ref 11.0–15.0)
Total Lymphocyte: 59.4 %
WBC: 3.6 10*3/uL — ABNORMAL LOW (ref 3.8–10.8)

## 2022-10-12 LAB — HIV-1 RNA QUANT-NO REFLEX-BLD
HIV 1 RNA Quant: NOT DETECTED {copies}/mL
HIV-1 RNA Quant, Log: NOT DETECTED {Log_copies}/mL

## 2022-10-12 LAB — RPR: RPR Ser Ql: NONREACTIVE

## 2022-10-18 ENCOUNTER — Telehealth: Payer: Self-pay

## 2022-10-18 LAB — CYTOLOGY - PAP: Adequacy: ABNORMAL

## 2022-10-18 NOTE — Telephone Encounter (Signed)
-----   Message from [place] sent at 10/18/2022 11:50 AM EDT ----- Non diagnostic, needs a repeat anal pap smear some time in the future when convenient to pt ----- Message ----- From: Janace Hoard Lab Results In Sent: 10/10/2022  11:19 PM EDT To: Randall Hiss, MD

## 2022-10-18 NOTE — Telephone Encounter (Signed)
I spoke to the patient and advised him that we will need to repeat the anal pap. Patient verbalized understanding Darrell [name]

## 2022-10-19 ENCOUNTER — Other Ambulatory Visit (HOSPITAL_COMMUNITY)
Admission: RE | Admit: 2022-10-19 | Discharge: 2022-10-19 | Disposition: A | Discharge status: Home / Self Care | Payer: Federal, State, Local not specified - PPO | Source: Ambulatory Visit | Attending: Infectious Disease | Admitting: Infectious Disease

## 2022-10-19 ENCOUNTER — Other Ambulatory Visit: Payer: Federal, State, Local not specified - PPO

## 2022-10-19 ENCOUNTER — Other Ambulatory Visit: Payer: Self-pay

## 2022-10-19 DIAGNOSIS — B20 Human immunodeficiency virus [HIV] disease: Secondary | ICD-10-CM | POA: Insufficient documentation

## 2022-10-26 ENCOUNTER — Telehealth: Payer: Self-pay

## 2022-10-26 DIAGNOSIS — R85619 Unspecified abnormal cytological findings in specimens from anus: Secondary | ICD-10-CM

## 2022-10-26 LAB — CYTOLOGY - PAP
Adequacy: ABSENT
Comment: NEGATIVE
Diagnosis: UNDETERMINED — AB
High risk HPV: NEGATIVE

## 2022-10-26 NOTE — Telephone Encounter (Signed)
Spoke with Davonn, discussed abnormal pap results and referral to [facility].   Patient verbalized understanding and has no further questions.   Sandie Ano, RN

## 2022-10-26 NOTE — Telephone Encounter (Signed)
-----   Message from [place] sent at 10/26/2022  2:29 PM EDT ----- ASCUS referral to General Surgery ----- Message ----- From: Interface, Lab In [place] Sent: 10/26/2022   8:30 AM EDT To: Randall Hiss, MD

## 2022-12-12 ENCOUNTER — Ambulatory Visit: Payer: Self-pay | Admitting: General Surgery

## 2022-12-27 ENCOUNTER — Telehealth: Payer: Federal, State, Local not specified - PPO | Admitting: Family Medicine

## 2022-12-27 DIAGNOSIS — K2900 Acute gastritis without bleeding: Secondary | ICD-10-CM | POA: Diagnosis not present

## 2022-12-27 MED ORDER — ONDANSETRON 4 MG PO TBDP
4.0000 mg | ORAL_TABLET | Freq: Three times a day (TID) | ORAL | 0 refills | Status: DC | PRN
Start: 2022-12-27 — End: 2023-05-05

## 2022-12-27 NOTE — Patient Instructions (Addendum)
Darrell [name], thank you for joining Freddy Finner, NP for today's virtual visit.  While this provider is not your primary care provider (PCP), if your PCP is located in our provider database this encounter information will be shared with them immediately following your visit.   A [facility] MyChart account gives you access to today's visit and all your visits, tests, and labs performed at [facility] " click here if you don't have a [facility] MyChart account or go to [email or web address]  Consent: (Patient) Darrell [name] provided verbal consent for this virtual visit at the beginning of the encounter.  Current Medications:  Current Outpatient Medications:    ondansetron (ZOFRAN-ODT) 4 MG disintegrating tablet, Take 1 tablet (4 mg total) by mouth every 8 (eight) hours as needed for nausea or vomiting., Disp: 10 tablet, Rfl: 0   dolutegravir-lamiVUDine (DOVATO) 50-300 MG tablet, Take 1 tablet by mouth daily., Disp: 30 tablet, Rfl: 11   Multiple Vitamin (MULTI-VITAMINS) TABS, Take by mouth., Disp: , Rfl:    Medications ordered in this encounter:  Meds ordered this encounter  Medications   ondansetron (ZOFRAN-ODT) 4 MG disintegrating tablet    Sig: Take 1 tablet (4 mg total) by mouth every 8 (eight) hours as needed for nausea or vomiting.    Dispense:  10 tablet    Refill:  0    Order Specific Question:   Supervising Provider    Answer:   Merrilee Jansky [id number]     *If you need refills on other medications prior to your next appointment, please contact your pharmacy*  Follow-Up: Call back or seek an in-person evaluation if the symptoms worsen or if the condition fails to improve as anticipated.  [facility] [phone]  Other Instructions  Parke Simmers Diet A bland diet may consist of soft foods or foods that are not high in fat or are not greasy, acidic, or spicy. Avoiding certain foods may cause less irritation to your mouth, throat,  stomach, or gastrointestinal tract. Avoiding certain foods may make you feel better. Everyone's tolerances are different. A bland diet should be based on what you can tolerate and what may cause discomfort. What is my plan? Your health care provider or dietitian may recommend specific changes to your diet to treat your symptoms. These changes may include: Eating small meals frequently. Cooking food until it is soft enough to chew easily. Taking the time to chew your food thoroughly, so it is easy to swallow and digest. Avoiding foods that cause you discomfort. These may include spicy food, fried food, greasy foods, hard-to-chew foods, or citrus fruits and juices. Drinking slowly. What are tips for following this plan? Reading food labels To reduce fiber intake, look for food labels that say "whole," such as whole wheat or whole grain. Shopping Avoid food items that may have nuts or seeds. Avoid vegetables that may make you gassy or have a tough texture, such as broccoli, cauliflower, or corn. Cooking Cook foods thoroughly so they have a soft texture. Meal planning Make sure you include foods from all food groups to eat a balanced diet. Eat a variety of types of foods. Eat foods and drink beverages that do not cause you discomfort. These may include soups and broths with cooked meats, pasta, and vegetables. Lifestyle Sit up after meals, avoid tight clothing, and take time to eat and chew your food slowly. Ask your health care provider whether you should take dietary supplements. General information Mildly season your  foods. Some seasonings, such as cayenne pepper, vinegar, or hot sauce, may cause irritation. The foods, beverages, or seasonings to avoid should be based on individual tolerance. What foods should I eat? Fruits Canned or cooked fruit such as peaches, pears, or applesauce. Bananas. Vegetables Well-cooked vegetables. Canned or cooked vegetables such as carrots, green beans,  beets, or spinach. Mashed or boiled potatoes. Grains  Hot cereals, such as cream of wheat and processed oatmeal. Rice. Bread, crackers, pasta, or tortillas made from refined white flour. Meats and other proteins  Eggs. Creamy peanut butter or other nut butters. Lean, well-cooked tender meats, such as beef, pork, chicken, or fish. Dairy Low-fat dairy products such as milk, cottage cheese, or yogurt. Beverages  Water. Herbal tea. Apple juice. Fats and oils Mild salad dressings. Canola or olive oil. Sweets and desserts Low-fat pudding, custard, or ice cream. Fruit gelatin. The items listed above may not be a complete list of foods and beverages you can eat. Contact a dietitian for more information. What foods should I avoid? Fruits Citrus fruits, such as oranges and grapefruit. Fruits with a stringy texture. Fruits that have lots of seeds, such as kiwi or strawberries. Dried fruits. Vegetables Raw, uncooked vegetables. Salads. Grains Whole grain breads, muffins, and cereals. Meats and other proteins Tough, fibrous meats. Highly seasoned meat such as corned beef, smoked meats, or fish. Processed high-fat meats such as brats, hot dogs, or sausage. Dairy Full-fat dairy foods such as ice cream and cheese. Beverages Caffeinated drinks. Alcohol. Seasonings and condiments Strongly flavored seasonings or condiments. Hot sauce. Salsa. Other foods Spicy foods. Fried or greasy foods. Sour foods, such as pickled or fermented foods like sauerkraut. Foods high in fiber. The items listed above may not be a complete list of foods and beverages you should avoid. Contact a dietitian for more information. Summary A bland diet should be based on individual tolerance. It may consist of foods that are soft textured and do not have a lot of fat, fiber, acid, or seasonings. A bland diet may be recommended because avoiding certain foods, beverages, or spices may make you feel better. This information is  not intended to replace advice given to you by your health care provider. Make sure you discuss any questions you have with your health care provider. Document Revised: 12/27/2020 Document Reviewed: 12/27/2020 Elsevier Patient Education  2024 Elsevier Inc.    If you have been instructed to have an in-person evaluation today at a local Urgent Care facility, please use the link below. It will take you to a list of all of our available [facility], including address, phone number and hours of operation. Please do not delay care.  [facility]  If you or a family member do not have a primary care provider, use the link below to schedule a visit and establish care. When you choose a [facility] primary care physician or advanced practice provider, you gain a long-term partner in health. Find a Primary Care Provider  Learn more about [facility]'s in-office and virtual care options: [facility] - Get Care Now

## 2022-12-27 NOTE — Progress Notes (Signed)
Virtual Visit Consent   Darrell [name], you are scheduled for a virtual visit with a [facility] provider today. Just as with appointments in the office, your consent must be obtained to participate. Your consent will be active for this visit and any virtual visit you may have with one of our providers in the next 365 days. If you have a MyChart account, a copy of this consent can be sent to you electronically.  As this is a virtual visit, video technology does not allow for your provider to perform a traditional examination. This may limit your provider's ability to fully assess your condition. If your provider identifies any concerns that need to be evaluated in person or the need to arrange testing (such as labs, EKG, etc.), we will make arrangements to do so. Although advances in technology are sophisticated, we cannot ensure that it will always work on either your end or our end. If the connection with a video visit is poor, the visit may have to be switched to a telephone visit. With either a video or telephone visit, we are not always able to ensure that we have a secure connection.  By engaging in this virtual visit, you consent to the provision of healthcare and authorize for your insurance to be billed (if applicable) for the services provided during this visit. Depending on your insurance coverage, you may receive a charge related to this service.  I need to obtain your verbal consent now. Are you willing to proceed with your visit today? LANIER MILLON has provided verbal consent on 12/27/2022 for a virtual visit (video or telephone). Freddy Finner, NP  Date: 12/27/2022 2:04 PM  Virtual Visit via Video Note   I, Freddy Finner, connected with  Darrell [name]  ([id number], 04-30-1990) on 12/27/22 at  2:00 PM EST by a video-enabled telemedicine application and verified that I am speaking with the correct person using two identifiers.  Location: Patient: Virtual Visit Location Patient:  Home Provider: Virtual Visit Location Provider: Home Office   I discussed the limitations of evaluation and management by telemedicine and the availability of in person appointments. The patient expressed understanding and agreed to proceed.    History of Present Illness: [name] is a 32 y.o. who identifies as a male who was assigned male at birth, and is being seen today for nausea  Onset was yesterday after eating [employer] started with nausea, vomiting, diarrhea. Passed out in bathroom EMS called- checked BP and blood sugar and it was all over ok. But still feeling bad.  Associated symptoms are no appetite, nausea, feels like gut is "rumbling" , sweating and chills, cramping like sensation  Modifying factors are Gatorade, fluids, rest pepto   Denies chest pain, shortness of breath,chills   Problems:  Patient Active Problem List   Diagnosis Date Noted   Vapes nicotine containing substance 04/17/2022   Anxiety and depression 04/17/2022   COVID-19 04/17/2022   Vaccine counseling 04/17/2022   Chronic right shoulder pain 03/22/2022   HTN (hypertension) 09/05/2018   AKI (acute kidney injury) (HCC) 07/10/2016   Family history of colon cancer 06/21/2016   CRI (chronic renal insufficiency), stage 1 01/11/2016   Encounter for medical examination to establish care 09/09/2014   Elevated blood pressure 05/20/2014   HIV disease (HCC) 10/08/2013   HEADACHE, TENSION 11/09/2006   RHINITIS, ALLERGIC NEC 11/09/2006    Allergies:  Allergies  Allergen Reactions   Sulfonamide Derivatives     unknown  Medications:  Current Outpatient Medications:    dolutegravir-lamiVUDine (DOVATO) 50-300 MG tablet, Take 1 tablet by mouth daily., Disp: 30 tablet, Rfl: 11   Multiple Vitamin (MULTI-VITAMINS) TABS, Take by mouth., Disp: , Rfl:    ondansetron (ZOFRAN-ODT) 4 MG disintegrating tablet, Take 1 tablet (4 mg total) by mouth every 8 (eight) hours as needed for nausea or vomiting., Disp: 20 tablet,  Rfl: 0  Observations/Objective: Patient is well-developed, well-nourished in no acute distress.  Resting comfortably at home- is laying down on bed  Head is normocephalic, atraumatic.  No labored breathing.  Speech is clear and coherent with logical content.  Patient is alert and oriented at baseline.    Assessment and Plan:   1. Acute gastritis without hemorrhage, unspecified gastritis type  - ondansetron (ZOFRAN-ODT) 4 MG disintegrating tablet; Take 1 tablet (4 mg total) by mouth every 8 (eight) hours as needed for nausea or vomiting.  Dispense: 10 tablet; Refill: 0  -bland diet -rest -hydration -Zofran -in person if not getting better or fevers   Work note for today- will call if he needs tomorrow   Reviewed side effects, risks and benefits of medication.    Patient acknowledged agreement and understanding of the plan.   Past Medical, Surgical, Social History, Allergies, and Medications have been Reviewed.    Follow Up Instructions: I discussed the assessment and treatment plan with the patient. The patient was provided an opportunity to ask questions and all were answered. The patient agreed with the plan and demonstrated an understanding of the instructions.  A copy of instructions were sent to the patient via MyChart unless otherwise noted below.    The patient was advised to call back or seek an in-person evaluation if the symptoms worsen or if the condition fails to improve as anticipated.    Freddy Finner, NP

## 2023-01-08 ENCOUNTER — Ambulatory Visit: Payer: Self-pay | Admitting: General Surgery

## 2023-01-08 DIAGNOSIS — R85610 Atypical squamous cells of undetermined significance on cytologic smear of anus (ASC-US): Secondary | ICD-10-CM | POA: Diagnosis not present

## 2023-01-08 NOTE — H&P (Signed)
   REFERRING PHYSICIAN:  Daiva Eves, [address]  PROVIDER:  Elenora Gamma, MD  MRN: [id number] DOB: Dec 04, 1990 DATE OF ENCOUNTER: 01/08/2023  Subjective   Chief Complaint: New Consultation     History of Present Illness: Darrell [name] is a 32 y.o. male who is seen today as an office consultation at the request of Dr. Daiva Eves for evaluation of New Consultation .  Patient underwent an anal Pap test in August 2024.  This returned as ASCUS.  Negative for high risk HPV.   Review of Systems: A complete review of systems was obtained from the patient.  I have reviewed this information and discussed as appropriate with the patient.  See HPI as well for other ROS.   Medical History: History reviewed. No pertinent past medical history.  There is no problem list on file for this patient.   History reviewed. No pertinent surgical history.   Allergies  Allergen Reactions   Sulfamethoxazole-Trimethoprim Other (See Comments)    Pt said he don't know it happened when he was little.    Current Outpatient Medications on File Prior to Visit  Medication Sig Dispense Refill   DOVATO 50-300 mg Tab Take 1 tablet by mouth once daily     multivitamin tablet Take 1 tablet by mouth once daily     No current facility-administered medications on file prior to visit.    History reviewed. No pertinent family history.   Social History   Tobacco Use  Smoking Status Never  Smokeless Tobacco Never     Social History   Socioeconomic History   Marital status: Single  Tobacco Use   Smoking status: Never   Smokeless tobacco: Never  Substance and Sexual Activity   Alcohol use: Yes   Drug use: Yes    Types: Marijuana    Objective:    Vitals:   01/08/23 1132  BP: 118/83  Pulse: 80  Temp: 36.7 C (98 F)  SpO2: 98%  Weight: 83.9 kg (185 lb)  Height: 180.3 cm (5\' 11" )     Exam Gen: NAD Abd: soft    Labs, Imaging and Diagnostic Testing: HIV: ND CD4:  834  Assessment and Plan:  Diagnoses and all orders for this visit:  Pap smear of anus with ASCUS     50 old male with well-controlled HIV who presents to the office today for a Pap test that showed ASCUS.  We discussed proceeding with high-resolution anoscopy with possible biopsy and ablation.  Discussed the implications for this and the reason to do this.  We discussed risk of postoperative pain, bleeding and recurrence.  All questions were answered.  Patient would like to schedule this procedure.  Vanita Panda, MD Colon and Rectal Surgery [facility]

## 2023-01-08 NOTE — H&P (View-Only) (Signed)
REFERRING PHYSICIAN:  Daiva [name], [address]  PROVIDER:  Elenora Gamma, MD  MRN: [id number] DOB: [birth year] DATE OF ENCOUNTER: 01/08/2023  Subjective   Chief Complaint: New Consultation     History of Present Illness: Darrell [name] is a 32 y.o. male who is seen today as an office consultation at the request of Dr. Daiva [name] for evaluation of New Consultation .  Patient underwent an anal Pap test in August 2024.  This returned as ASCUS.  Negative for high risk HPV.   Review of Systems: A complete review of systems was obtained from the patient.  I have reviewed this information and discussed as appropriate with the patient.  See HPI as well for other ROS.   Medical History: History reviewed. No pertinent past medical history.  There is no problem list on file for this patient.   History reviewed. No pertinent surgical history.   Allergies  Allergen Reactions   Sulfamethoxazole-Trimethoprim Other (See Comments)    Pt said he don't know it happened when he was little.    Current Outpatient Medications on File Prior to Visit  Medication Sig Dispense Refill   DOVATO 50-300 mg Tab Take 1 tablet by mouth once daily     multivitamin tablet Take 1 tablet by mouth once daily     No current facility-administered medications on file prior to visit.    History reviewed. No pertinent family history.   Social History   Tobacco Use  Smoking Status Never  Smokeless Tobacco Never     Social History   Socioeconomic History   Marital status: Single  Tobacco Use   Smoking status: Never   Smokeless tobacco: Never  Substance and Sexual Activity   Alcohol use: Yes   Drug use: Yes    Types: Marijuana    Objective:    Vitals:   01/08/23 1132  BP: 118/83  Pulse: 80  Temp: 36.7 C (98 F)  SpO2: 98%  Weight: 83.9 kg (185 lb)  Height: 180.3 cm (5\' 11" )     Exam Gen: NAD Abd: soft    Labs, Imaging and Diagnostic Testing: HIV: ND CD4:  834  Assessment and Plan:  Diagnoses and all orders for this visit:  Pap smear of anus with ASCUS     8 old male with well-controlled HIV who presents to the office today for a Pap test that showed ASCUS.  We discussed proceeding with high-resolution anoscopy with possible biopsy and ablation.  Discussed the implications for this and the reason to do this.  We discussed risk of postoperative pain, bleeding and recurrence.  All questions were answered.  Patient would like to schedule this procedure.  Darrell Panda, MD Colon and Rectal Surgery [facility]

## 2023-01-09 ENCOUNTER — Encounter (HOSPITAL_BASED_OUTPATIENT_CLINIC_OR_DEPARTMENT_OTHER): Payer: Self-pay | Admitting: General Surgery

## 2023-01-09 NOTE — Progress Notes (Signed)
Spoke w/ via phone for pre-op interview---Darrell [name] needs dos----   NONE      [name] results------ COVID test -----patient states asymptomatic no test needed Arrive at -------1100 NPO after MN NO Solid Food.  Clear liquids from MN until---1000 Med rec completed Medications to take morning of surgery -----Dovato Diabetic medication ----- Patient instructed no nail polish to be worn day of surgery Patient instructed to bring photo id and insurance card day of surgery Patient aware to have Driver (ride ) / caregiver    for 24 hours after surgery - Mother Darrell [name] Patient Special Instructions ----- Pre-Op special Instructions ----- Patient verbalized understanding of instructions that were given at this phone interview. Patient denies chest pain, sob, fever, cough at the interview.

## 2023-01-17 ENCOUNTER — Ambulatory Visit (HOSPITAL_BASED_OUTPATIENT_CLINIC_OR_DEPARTMENT_OTHER)
Admission: RE | Admit: 2023-01-17 | Discharge: 2023-01-17 | Disposition: A | Discharge status: Home / Self Care | Payer: Federal, State, Local not specified - PPO | Attending: General Surgery | Admitting: General Surgery

## 2023-01-17 ENCOUNTER — Encounter (HOSPITAL_BASED_OUTPATIENT_CLINIC_OR_DEPARTMENT_OTHER): Payer: Self-pay | Admitting: General Surgery

## 2023-01-17 ENCOUNTER — Ambulatory Visit (HOSPITAL_BASED_OUTPATIENT_CLINIC_OR_DEPARTMENT_OTHER): Payer: Self-pay | Admitting: Certified Registered Nurse Anesthetist

## 2023-01-17 ENCOUNTER — Encounter (HOSPITAL_BASED_OUTPATIENT_CLINIC_OR_DEPARTMENT_OTHER)
Admission: RE | Disposition: A | Discharge status: Home / Self Care | Payer: Self-pay | Source: Home / Self Care | Attending: General Surgery

## 2023-01-17 ENCOUNTER — Ambulatory Visit (HOSPITAL_BASED_OUTPATIENT_CLINIC_OR_DEPARTMENT_OTHER): Payer: Federal, State, Local not specified - PPO | Admitting: Certified Registered Nurse Anesthetist

## 2023-01-17 ENCOUNTER — Other Ambulatory Visit: Payer: Self-pay

## 2023-01-17 DIAGNOSIS — R519 Headache, unspecified: Secondary | ICD-10-CM | POA: Diagnosis not present

## 2023-01-17 DIAGNOSIS — F419 Anxiety disorder, unspecified: Secondary | ICD-10-CM | POA: Diagnosis not present

## 2023-01-17 DIAGNOSIS — F32A Depression, unspecified: Secondary | ICD-10-CM | POA: Diagnosis not present

## 2023-01-17 DIAGNOSIS — K6282 Dysplasia of anus: Secondary | ICD-10-CM | POA: Diagnosis not present

## 2023-01-17 DIAGNOSIS — R85610 Atypical squamous cells of undetermined significance on cytologic smear of anus (ASC-US): Secondary | ICD-10-CM | POA: Diagnosis not present

## 2023-01-17 DIAGNOSIS — F17210 Nicotine dependence, cigarettes, uncomplicated: Secondary | ICD-10-CM | POA: Diagnosis not present

## 2023-01-17 DIAGNOSIS — Z79624 Long term (current) use of inhibitors of nucleotide synthesis: Secondary | ICD-10-CM | POA: Diagnosis not present

## 2023-01-17 DIAGNOSIS — I10 Essential (primary) hypertension: Secondary | ICD-10-CM | POA: Diagnosis not present

## 2023-01-17 DIAGNOSIS — F418 Other specified anxiety disorders: Secondary | ICD-10-CM | POA: Diagnosis not present

## 2023-01-17 HISTORY — PX: RECTAL BIOPSY: SHX2303

## 2023-01-17 HISTORY — PX: HIGH RESOLUTION ANOSCOPY: SHX6345

## 2023-01-17 SURGERY — ANOSCOPY, HIGH RESOLUTION
Anesthesia: Monitor Anesthesia Care

## 2023-01-17 MED ORDER — MIDAZOLAM HCL 2 MG/2ML IJ SOLN
INTRAMUSCULAR | Status: DC | PRN
  Administered 2023-01-17: 2 mg via INTRAVENOUS

## 2023-01-17 MED ORDER — PROPOFOL 500 MG/50ML IV EMUL
INTRAVENOUS | Status: DC | PRN
  Administered 2023-01-17: 200 ug/kg/min via INTRAVENOUS

## 2023-01-17 MED ORDER — ONDANSETRON HCL 4 MG/2ML IJ SOLN: 4.0000 mg | Freq: Once | INTRAMUSCULAR | Status: DC | PRN

## 2023-01-17 MED ORDER — FENTANYL CITRATE (PF) 250 MCG/5ML IJ SOLN
INTRAMUSCULAR | Status: DC | PRN
  Administered 2023-01-17: 25 ug via INTRAVENOUS
  Administered 2023-01-17: 50 ug via INTRAVENOUS
  Administered 2023-01-17: 25 ug via INTRAVENOUS

## 2023-01-17 MED ORDER — OXYCODONE HCL 5 MG/5ML PO SOLN: 5.0000 mg | Freq: Once | ORAL | Status: DC | PRN

## 2023-01-17 MED ORDER — SODIUM CHLORIDE 0.9% FLUSH: 3.0000 mL | Freq: Two times a day (BID) | INTRAVENOUS | Status: DC

## 2023-01-17 MED ORDER — PROPOFOL 10 MG/ML IV BOLUS
INTRAVENOUS | Status: AC
  Filled 2023-01-17: qty 20

## 2023-01-17 MED ORDER — BUPIVACAINE-EPINEPHRINE 0.5% -1:200000 IJ SOLN
INTRAMUSCULAR | Status: DC | PRN
  Administered 2023-01-17: 30 mL

## 2023-01-17 MED ORDER — LIDOCAINE 2% (20 MG/ML) 5 ML SYRINGE
INTRAMUSCULAR | Status: DC | PRN
  Administered 2023-01-17: 40 mg via INTRAVENOUS

## 2023-01-17 MED ORDER — OXYCODONE HCL 5 MG PO TABS: 5.0000 mg | ORAL_TABLET | Freq: Once | ORAL | Status: DC | PRN

## 2023-01-17 MED ORDER — ONDANSETRON HCL 4 MG/2ML IJ SOLN
INTRAMUSCULAR | Status: DC | PRN
  Administered 2023-01-17: 4 mg via INTRAVENOUS

## 2023-01-17 MED ORDER — DEXMEDETOMIDINE HCL IN NACL 80 MCG/20ML IV SOLN
INTRAVENOUS | Status: DC | PRN
  Administered 2023-01-17 (×2): 8 ug via INTRAVENOUS

## 2023-01-17 MED ORDER — HYDROMORPHONE HCL 1 MG/ML IJ SOLN: 0.2500 mg | INTRAMUSCULAR | Status: DC | PRN

## 2023-01-17 MED ORDER — SODIUM CHLORIDE 0.9 % IV SOLN
INTRAVENOUS | Status: DC
  Administered 2023-01-17: 500 mL via INTRAVENOUS

## 2023-01-17 MED ORDER — LACTATED RINGERS IV SOLN: INTRAVENOUS | Status: DC

## 2023-01-17 MED ORDER — FENTANYL CITRATE (PF) 100 MCG/2ML IJ SOLN
INTRAMUSCULAR | Status: AC
  Filled 2023-01-17: qty 2

## 2023-01-17 MED ORDER — AMISULPRIDE (ANTIEMETIC) 5 MG/2ML IV SOLN: 10.0000 mg | Freq: Once | INTRAVENOUS | Status: DC | PRN

## 2023-01-17 MED ORDER — ACETIC ACID 5 % SOLN
Status: DC | PRN
  Administered 2023-01-17: 1 via TOPICAL

## 2023-01-17 MED ORDER — ACETAMINOPHEN 10 MG/ML IV SOLN: 1000.0000 mg | Freq: Once | INTRAVENOUS | Status: DC | PRN

## 2023-01-17 MED ORDER — PROPOFOL 10 MG/ML IV BOLUS
INTRAVENOUS | Status: DC | PRN
  Administered 2023-01-17: 20 mg via INTRAVENOUS

## 2023-01-17 MED ORDER — 0.9 % SODIUM CHLORIDE (POUR BTL) OPTIME
TOPICAL | Status: DC | PRN
  Administered 2023-01-17: 500 mL

## 2023-01-17 MED ORDER — MIDAZOLAM HCL 2 MG/2ML IJ SOLN
INTRAMUSCULAR | Status: AC
  Filled 2023-01-17: qty 2

## 2023-01-17 SURGICAL SUPPLY — 53 items
APPLICATOR COTTON TIP 6 STRL (MISCELLANEOUS) IMPLANT
APPLICATOR COTTON TIP 6IN STRL (MISCELLANEOUS)
BENZOIN TINCTURE PRP APPL 2/3 (GAUZE/BANDAGES/DRESSINGS) ×2 IMPLANT
BLADE EXTENDED COATED 6.5IN (ELECTRODE) IMPLANT
BLADE SURG 10 STRL SS (BLADE) ×1 IMPLANT
BRIEF MESH DISP LRG (UNDERPADS AND DIAPERS) ×1 IMPLANT
COVER BACK TABLE 60X90IN (DRAPES) ×1 IMPLANT
COVER MAYO STAND STRL (DRAPES) ×1 IMPLANT
DRAPE HYSTEROSCOPY (MISCELLANEOUS) IMPLANT
DRAPE LAPAROTOMY 100X72 PEDS (DRAPES) ×1 IMPLANT
DRAPE SHEET LG 3/4 BI-LAMINATE (DRAPES) IMPLANT
DRAPE UTILITY XL STRL (DRAPES) ×1 IMPLANT
DRSG TELFA 3X8 NADH STRL (GAUZE/BANDAGES/DRESSINGS) IMPLANT
ELECT REM PT RETURN 9FT ADLT (ELECTROSURGICAL) ×1
ELECTRODE REM PT RTRN 9FT ADLT (ELECTROSURGICAL) ×1 IMPLANT
GAUZE 4X4 16PLY ~~LOC~~+RFID DBL (SPONGE) ×1 IMPLANT
GAUZE PAD ABD 8X10 STRL (GAUZE/BANDAGES/DRESSINGS) ×1 IMPLANT
GAUZE SPONGE 4X4 12PLY STRL (GAUZE/BANDAGES/DRESSINGS) ×1 IMPLANT
GAUZE SPONGE 4X4 12PLY STRL LF (GAUZE/BANDAGES/DRESSINGS) IMPLANT
GLOVE BIO SURGEON STRL SZ 6.5 (GLOVE) ×1 IMPLANT
GLOVE INDICATOR 6.5 STRL GRN (GLOVE) ×1 IMPLANT
GOWN STRL REUS W/TWL XL LVL3 (GOWN DISPOSABLE) ×1 IMPLANT
HYDROGEN PEROXIDE 16OZ (MISCELLANEOUS) ×1 IMPLANT
IV CATH 14GX2 1/4 (CATHETERS) ×1 IMPLANT
IV CATH 18G SAFETY (IV SOLUTION) ×1 IMPLANT
KIT SIGMOIDOSCOPE (SET/KITS/TRAYS/PACK) IMPLANT
KIT TURNOVER CYSTO (KITS) ×1 IMPLANT
LEGGING LITHOTOMY PAIR STRL (DRAPES) IMPLANT
LOOP VASCLR MAXI BLUE 18IN ST (MISCELLANEOUS) IMPLANT
NDL HYPO 22X1.5 SAFETY MO (MISCELLANEOUS) ×1 IMPLANT
NEEDLE HYPO 22X1.5 SAFETY MO (MISCELLANEOUS) ×1
NS IRRIG 500ML POUR BTL (IV SOLUTION) ×1 IMPLANT
PACK BASIN DAY SURGERY FS (CUSTOM PROCEDURE TRAY) ×1 IMPLANT
PAD ARMBOARD 7.5X6 YLW CONV (MISCELLANEOUS) IMPLANT
PENCIL SMOKE EVACUATOR (MISCELLANEOUS) ×1 IMPLANT
SLEEVE SCD COMPRESS KNEE MED (STOCKING) ×1 IMPLANT
SPIKE FLUID TRANSFER (MISCELLANEOUS) ×1 IMPLANT
SPONGE HEMORRHOID 8X3CM (HEMOSTASIS) IMPLANT
SPONGE SURGIFOAM ABS GEL 12-7 (HEMOSTASIS) IMPLANT
SUCTION TUBE FRAZIER 10FR DISP (SUCTIONS) IMPLANT
SUT CHROMIC 2 0 SH (SUTURE) IMPLANT
SUT CHROMIC 3 0 SH 27 (SUTURE) IMPLANT
SUT ETHIBOND 0 (SUTURE) IMPLANT
SUT VIC AB 2-0 SH 27XBRD (SUTURE) IMPLANT
SUT VIC AB 3-0 SH 18 (SUTURE) IMPLANT
SUT VIC AB 3-0 SH 27XBRD (SUTURE) IMPLANT
SYR BULB IRRIG 60ML STRL (SYRINGE) ×1 IMPLANT
SYR CONTROL 10ML LL (SYRINGE) ×1 IMPLANT
TOWEL OR 17X24 6PK STRL BLUE (TOWEL DISPOSABLE) ×1 IMPLANT
TRAY DSU PREP LF (CUSTOM PROCEDURE TRAY) ×1 IMPLANT
TUBE CONNECTING 12X1/4 (SUCTIONS) ×1 IMPLANT
VASCULAR TIE MAXI BLUE 18IN ST (MISCELLANEOUS)
YANKAUER SUCT BULB TIP NO VENT (SUCTIONS) ×1 IMPLANT

## 2023-01-17 NOTE — Op Note (Signed)
01/17/2023  2:02 PM  PATIENT:  Darrell [name]  32 y.o. male  Patient Care Team: Loyal Jacobson, MD as PCP - General (Family Medicine) Daiva Eves, Lisette Grinder, MD as Consulting Physician (Infectious Diseases)  PRE-OPERATIVE DIAGNOSIS:  ASCUS on ANAL PAP TEST  POST-OPERATIVE DIAGNOSIS:  ASCUS on ANAL PAP TEST  PROCEDURE:   HIGH RESOLUTION ANOSCOPY RECTAL BIOPSY x 3    Surgeon(s): Romie Levee, MD  ASSISTANT: none   ANESTHESIA:   local and MAC  EBL: 5ml  Total I/O In: -  Out: 5 [Blood:5]  SPECIMEN:  Source of Specimen:  anterior anal canal, anterior perianal lesion and L lateral anal canal  DISPOSITION OF SPECIMEN:  PATHOLOGY  COUNTS:  YES  PLAN OF CARE: Discharge to home after PACU  PATIENT DISPOSITION:  PACU - hemodynamically stable.  INDICATION: 32 y.o. M with HIV who was found to have ASCUS on anal pap test  OR FINDINGS: acetowhite staining anterior midline anal canal distal to dentate line, L posterior lateral anal canal proximal to dentate line and a small area in perianal region at anterior midline  DESCRIPTION: The patient was identified in the preoperative holding area and taken to the OR where they were laid supine on the operating room table in lithotomy position. MAC anesthesia was smoothly induced.  The patient was then prepped and draped in the usual sterile fashion. A surgical timeout was performed indicating the correct patient, procedure, positioning and preoperative antibioitics. SCDs were noted to be in place and functioning prior to the operation.   After this was completed, a sponge was soaked in 5% acetic acid was placed over the perianal region. This was allowed to soak for 2 minutes. The sponge was removed and the perianal region was evaluated with a colposcope.  There was only one area that stained at anterior midline.  A small biopsy was taken and area was ablated.  The internal anal canal was evaluated via anoscopy with a Hill-Ferguson anoscope.   There were 2 areas of concern at the anterior midline and left lateral anal canal.  Biopsies were taken and acetowhite areas were ablated with electrocautery.  After this was completed, hemostasis was achieved and all of the biopsy sites were closed using a 3-0 chromic suture.  Additional marcaine was infused around biopsy sites, a dressing was applied and the patient was then awakened from anesthesia and sent to the postanesthesia care unit in stable condition. All counts were correct operating room staff.   Vanita Panda, MD  Colorectal and General Surgery [facility]

## 2023-01-17 NOTE — Discharge Instructions (Addendum)
Beginning the day after surgery:  You may sit in a tub of warm water 2-3 times a day to relieve discomfort.  Eat a regular diet high in fiber.  Avoid foods that give you constipation or diarrhea.  Avoid foods that are difficult to digest, such as seeds, nuts, corn or popcorn.  Do not go any longer than 2 days without a bowel movement.  You may take a dose of Milk of Magnesia if you become constipated.    Drink 6-8 glasses of water daily.  You may take Tylenol and/or Ibuprofen for added pain relief.    Walking is encouraged.  Avoid strenuous activity and heavy lifting for one month after surgery.    Call the office if you have any questions or concerns.  Call immediately if you develop:  Excessive rectal bleeding (more than a cup or passing large clots) Increased discomfort Fever greater than 100 F Difficulty urinating           Post Anesthesia Home Care Instructions  Activity: Get plenty of rest for the remainder of the day. A responsible individual must stay with you for 24 hours following the procedure.  For the next 24 hours, DO NOT: -Drive a car -Advertising copywriter -Drink alcoholic beverages -Take any medication unless instructed by your physician -Make any legal decisions or sign important papers.  Meals: Start with liquid foods such as gelatin or soup. Progress to regular foods as tolerated. Avoid greasy, spicy, heavy foods. If nausea and/or vomiting occur, drink only clear liquids until the nausea and/or vomiting subsides. Call your physician if vomiting continues.  Special Instructions/Symptoms: Your throat may feel dry or sore from the anesthesia or the breathing tube placed in your throat during surgery. If this causes discomfort, gargle with warm salt water. The discomfort should disappear within 24 hours.

## 2023-01-17 NOTE — Anesthesia Preprocedure Evaluation (Signed)
Anesthesia Evaluation  Patient identified by MRN, date of birth, ID band Patient awake    Reviewed: Allergy & Precautions, NPO status , Patient's Chart, lab work & pertinent test results  Airway Mallampati: II  TM Distance: >3 FB Neck ROM: Full    Dental no notable dental hx. (+) Teeth Intact, Dental Advisory Given   Pulmonary Current Smoker and Patient abstained from smoking.   Pulmonary exam normal breath sounds clear to auscultation       Cardiovascular hypertension, Normal cardiovascular exam Rhythm:Regular Rate:Normal     Neuro/Psych  Headaches PSYCHIATRIC DISORDERS Anxiety Depression       GI/Hepatic negative GI ROS, Neg liver ROS,,,  Endo/Other    Renal/GU negative Renal ROS     Musculoskeletal negative musculoskeletal ROS (+)    Abdominal   Peds  Hematology  (+) HIV  Anesthesia Other Findings   Reproductive/Obstetrics negative OB ROS                              Anesthesia Physical Anesthesia Plan  ASA: 3  Anesthesia Plan: MAC   Post-op Pain Management: Precedex and Ofirmev IV (intra-op)*   Induction: Intravenous  PONV Risk Score and Plan: 1 and Treatment may vary due to age or medical condition, Midazolam and Propofol infusion  Airway Management Planned: Nasal Cannula and Natural Airway  Additional Equipment: None  Intra-op Plan:   Post-operative Plan:   Informed Consent: I have reviewed the patients History and Physical, chart, labs and discussed the procedure including the risks, benefits and alternatives for the proposed anesthesia with the patient or authorized representative who has indicated his/her understanding and acceptance.     Dental advisory given  Plan Discussed with: CRNA  Anesthesia Plan Comments:          Anesthesia Quick Evaluation

## 2023-01-17 NOTE — Interval H&P Note (Signed)
History and Physical Interval Note:  01/17/2023 12:25 PM  Darrell [name]  has presented today for surgery, with the diagnosis of ASCUS.  The various methods of treatment have been discussed with the patient and family. After consideration of risks, benefits and other options for treatment, the patient has consented to  Procedure(s): HIGH RESOLUTION ANOSCOPY (N/A) POSSIBLE BIOPSY RECTAL AND ABLATION (N/A) as a surgical intervention.  The patient's history has been reviewed, patient examined, no change in status, stable for surgery.  I have reviewed the patient's chart and labs.  Questions were answered to the patient's satisfaction.    Vanita Panda, MD  Colorectal and General Surgery [facility] Surgery

## 2023-01-17 NOTE — Transfer of Care (Signed)
Immediate Anesthesia Transfer of Care Note  Patient: Darrell [name]  Procedure(s) Performed: HIGH RESOLUTION ANOSCOPY BIOPSY RECTAL AND ABLATION  Patient Location: PACU  Anesthesia Type:MAC  Level of Consciousness: sedated  Airway & Oxygen Therapy: Patient Spontanous Breathing and Patient connected to face mask oxygen  Post-op Assessment: Report given to RN and Post -op Vital signs reviewed and stable  Post vital signs: Reviewed and stable  Last Vitals:  Vitals Value Taken Time  BP    Temp    Pulse 80 01/17/23 1410  Resp 10 01/17/23 1410  SpO2 100 % 01/17/23 1410  Vitals shown include unfiled device data.  Last Pain:  Vitals:   01/17/23 1227  TempSrc:   PainSc: 0-No pain      Patients Stated Pain Goal: 4 (01/17/23 1227)  Complications: No notable events documented.

## 2023-01-17 NOTE — Anesthesia Postprocedure Evaluation (Signed)
Anesthesia Post Note  Patient: Darrell [name]  Procedure(s) Performed: HIGH RESOLUTION ANOSCOPY BIOPSY RECTAL AND ABLATION     Patient location during evaluation: PACU Anesthesia Type: MAC Level of consciousness: awake and alert Pain management: pain level controlled Vital Signs Assessment: post-procedure vital signs reviewed and stable Respiratory status: spontaneous breathing, nonlabored ventilation, respiratory function stable and patient connected to nasal cannula oxygen Cardiovascular status: stable and blood pressure returned to baseline Postop Assessment: no apparent nausea or vomiting Anesthetic complications: no   No notable events documented.  Last Vitals:  Vitals:   01/17/23 1530 01/17/23 1615  BP: 118/76 121/74  Pulse: 68 (!) 56  Resp: 14 16  Temp:  (!) 36.3 C  SpO2: 99% 99%    Last Pain:  Vitals:   01/17/23 1615  TempSrc:   PainSc: 0-No pain                 Trevor Iha

## 2023-01-19 LAB — SURGICAL PATHOLOGY

## 2023-01-22 ENCOUNTER — Encounter (HOSPITAL_BASED_OUTPATIENT_CLINIC_OR_DEPARTMENT_OTHER): Payer: Self-pay | Admitting: General Surgery

## 2023-02-06 DIAGNOSIS — K6282 Dysplasia of anus: Secondary | ICD-10-CM | POA: Diagnosis not present

## 2023-02-22 ENCOUNTER — Telehealth: Payer: BC Managed Care – PPO | Admitting: Family Medicine

## 2023-02-22 DIAGNOSIS — J069 Acute upper respiratory infection, unspecified: Secondary | ICD-10-CM

## 2023-02-22 NOTE — Progress Notes (Signed)
 Virtual Visit Consent   Darrell [name], you are scheduled for a virtual visit with a [facility] provider today. Just as with appointments in the office, your consent must be obtained to participate. Your consent will be active for this visit and any virtual visit you may have with one of our providers in the next 365 days. If you have a MyChart account, a copy of this consent can be sent to you electronically.  As this is a virtual visit, video technology does not allow for your provider to perform a traditional examination. This may limit your provider's ability to fully assess your condition. If your provider identifies any concerns that need to be evaluated in person or the need to arrange testing (such as labs, EKG, etc.), we will make arrangements to do so. Although advances in technology are sophisticated, we cannot ensure that it will always work on either your end or our end. If the connection with a video visit is poor, the visit may have to be switched to a telephone visit. With either a video or telephone visit, we are not always able to ensure that we have a secure connection.  By engaging in this virtual visit, you consent to the provision of healthcare and authorize for your insurance to be billed (if applicable) for the services provided during this visit. Depending on your insurance coverage, you may receive a charge related to this service.  I need to obtain your verbal consent now. Are you willing to proceed with your visit today? [name] has provided verbal consent on 02/22/2023 for a virtual visit (video or telephone). Chiquita CHRISTELLA Barefoot, NP  Date: 02/22/2023 5:09 PM  Virtual Visit via Video Note   I, Chiquita CHRISTELLA Barefoot, connected with  Darrell [name]  ([id number], Jun 05, 1990) on 02/22/23 at  5:15 PM EST by a video-enabled telemedicine application and verified that I am speaking with the correct person using two identifiers.  Location: Patient: Virtual Visit Location Patient:  Home Provider: Virtual Visit Location Provider: Home Office   I discussed the limitations of evaluation and management by telemedicine and the availability of in person appointments. The patient expressed understanding and agreed to proceed.    History of Present Illness: Darrell [name] is a 33 y.o. who identifies as a male who was assigned male at birth, and is being seen today for   Onset was 02/20/2023- 3 days ago- congestion, sinus pressure Associated symptoms are foggy headed, and muffled sound chills, Modifying factors are benadryl , day and nyquil, fluids, pain meds OTC and rest  Denies chest pain, shortness of breath, fevers, chills  Exposure to sick contacts- god children COVID test: no  Vaccines: not up to date  Problems:  Patient Active Problem List   Diagnosis Date Noted   Vapes nicotine containing substance 04/17/2022   Anxiety and depression 04/17/2022   COVID-19 04/17/2022   Vaccine counseling 04/17/2022   Chronic right shoulder pain 03/22/2022   HTN (hypertension) 09/05/2018   AKI (acute kidney injury) (HCC) 07/10/2016   Family history of colon cancer 06/21/2016   CRI (chronic renal insufficiency), stage 1 01/11/2016   Encounter for medical examination to establish care 09/09/2014   Elevated blood pressure 05/20/2014   HIV disease (HCC) 10/08/2013   HEADACHE, TENSION 11/09/2006   RHINITIS, ALLERGIC NEC 11/09/2006    Allergies:  Allergies  Allergen Reactions   Sulfonamide Derivatives     unknown   Medications:  Current Outpatient Medications:    dolutegravir -lamiVUDine (DOVATO )  50-300 MG tablet, Take 1 tablet by mouth daily., Disp: 30 tablet, Rfl: 11   Multiple Vitamin (MULTI-VITAMINS) TABS, Take by mouth., Disp: , Rfl:    ondansetron  (ZOFRAN -ODT) 4 MG disintegrating tablet, Take 1 tablet (4 mg total) by mouth every 8 (eight) hours as needed for nausea or vomiting., Disp: 10 tablet, Rfl: 0  Observations/Objective: Patient is well-developed,  well-nourished in no acute distress.  Resting comfortably  at home.  Head is normocephalic, atraumatic.  No labored breathing.  Speech is clear and coherent with logical content.  Patient is alert and oriented at baseline.  Congested tone  Assessment and Plan:  1. Viral URI (Primary)  URI recommendations: - Increased rest - Increasing Fluids - Acetaminophen  / ibuprofen  as needed for fever/pain.  - Salt water gargling, chloraseptic spray and throat lozenges - Mucinex if mucus is present and increasing.  - Saline nasal spray if congestion or if nasal passages feel dry. - Humidifying the air.   Reviewed side effects, risks and benefits of medication.    Patient acknowledged agreement and understanding of the plan.   Past Medical, Surgical, Social History, Allergies, and Medications have been Reviewed.     Follow Up Instructions: I discussed the assessment and treatment plan with the patient. The patient was provided an opportunity to ask questions and all were answered. The patient agreed with the plan and demonstrated an understanding of the instructions.  A copy of instructions were sent to the patient via MyChart unless otherwise noted below.    The patient was advised to call back or seek an in-person evaluation if the symptoms worsen or if the condition fails to improve as anticipated.    Chiquita CHRISTELLA Barefoot, NP

## 2023-02-22 NOTE — Patient Instructions (Signed)
  Darrell [name], thank you for joining Darrell [name], Darrell [name] for today's virtual visit.  While this provider is not your primary care provider (PCP), if your PCP is located in our provider database this encounter information will be shared with them immediately following your visit.   A Darrell [name] MyChart account gives you access to today's visit and all your visits, tests, and labs performed at [facility]  click here if you don't have a Darrell [name] MyChart account or go to [email or web address]  Consent: (Patient) Darrell [name] provided verbal consent for this virtual visit at the beginning of the encounter.  Current Medications:  Current Outpatient Medications:    dolutegravir -lamiVUDine (DOVATO ) 50-300 MG tablet, Take 1 tablet by mouth daily., Disp: 30 tablet, Rfl: 11   Multiple Vitamin (MULTI-VITAMINS) TABS, Take by mouth., Disp: , Rfl:    ondansetron  (ZOFRAN -ODT) 4 MG disintegrating tablet, Take 1 tablet (4 mg total) by mouth every 8 (eight) hours as needed for nausea or vomiting., Disp: 10 tablet, Rfl: 0   Medications ordered in this encounter:  No orders of the defined types were placed in this encounter.    *If you need refills on other medications prior to your next appointment, please contact your pharmacy*  Follow-Up: Call back or seek an in-person evaluation if the symptoms worsen or if the condition fails to improve as anticipated.  [facility] [phone]  Other Instructions  URI recommendations: - Increased rest - Increasing Fluids - Acetaminophen  / ibuprofen  as needed for fever/pain.  - Salt water gargling, chloraseptic spray and throat lozenges - Mucinex if mucus is present and increasing.  - Saline nasal spray if congestion or if nasal passages feel dry. - Humidifying the air.     If you have been instructed to have an in-person evaluation today at a local Urgent Care facility, please use the link below. It will take you to  a list of all of our available [facility], including address, phone number and hours of operation. Please do not delay care.  [facility]  If you or a family member do not have a primary care provider, use the link below to schedule a visit and establish care. When you choose a [facility] primary care physician or advanced practice provider, you gain a long-term partner in health. Find a Primary Care Provider  Learn more about [facility]'s in-office and virtual care options: [facility] - Get Care Now

## 2023-03-16 ENCOUNTER — Telehealth: Payer: Federal, State, Local not specified - PPO | Admitting: Physician Assistant

## 2023-03-16 DIAGNOSIS — J019 Acute sinusitis, unspecified: Secondary | ICD-10-CM

## 2023-03-16 DIAGNOSIS — B9689 Other specified bacterial agents as the cause of diseases classified elsewhere: Secondary | ICD-10-CM | POA: Diagnosis not present

## 2023-03-16 MED ORDER — AMOXICILLIN-POT CLAVULANATE 875-125 MG PO TABS: 1.0000 | ORAL_TABLET | Freq: Two times a day (BID) | ORAL | 0 refills | Status: DC

## 2023-03-16 NOTE — Progress Notes (Signed)
Virtual Visit Consent   Darrell [name], you are scheduled for a virtual visit with a Darrell [name] provider today. Just as with appointments in the office, your consent must be obtained to participate. Your consent will be active for this visit and any virtual visit you may have with one of our providers in the next 365 days. If you have a MyChart account, a copy of this consent can be sent to you electronically.  As this is a virtual visit, video technology does not allow for your provider to perform a traditional examination. This may limit your provider's ability to fully assess your condition. If your provider identifies any concerns that need to be evaluated in person or the need to arrange testing (such as labs, EKG, etc.), we will make arrangements to do so. Although advances in technology are sophisticated, we cannot ensure that it will always work on either your end or our end. If the connection with a video visit is poor, the visit may have to be switched to a telephone visit. With either a video or telephone visit, we are not always able to ensure that we have a secure connection.  By engaging in this virtual visit, you consent to the provision of healthcare and authorize for your insurance to be billed (if applicable) for the services provided during this visit. Depending on your insurance coverage, you may receive a charge related to this service.  I need to obtain your verbal consent now. Are you willing to proceed with your visit today? Darrell [name] has provided verbal consent on 03/16/2023 for a virtual visit (video or telephone). [name], PA-C  Date: 03/16/2023 4:56 PM  Virtual Visit via Video Note   I, [name], connected with  Darrell [name]  ([id number], Nov 20, 1990) on 03/16/23 at  4:45 PM EST by a video-enabled telemedicine application and verified that I am speaking with the correct person using two identifiers.  Location: Patient: Virtual Visit  Location Patient: Home Provider: Virtual Visit Location Provider: Home Office   I discussed the limitations of evaluation and management by telemedicine and the availability of in person appointments. The patient expressed understanding and agreed to proceed.    History of Present Illness: Darrell [name] is a 33 y.o. who identifies as a male who was assigned male at birth, and is being seen today for sinus congestion.  HPI: Sinusitis This is a new problem. The current episode started 1 to 4 weeks ago (Did have some improvement in symptoms from when last seen on 02/22/23, but worsened at the beginning of this week). The problem has been gradually worsening since onset. There has been no fever (99). The pain is moderate. Associated symptoms include chills, congestion, coughing (mild last night), ear pain (yesterday, but improved), headaches and sinus pressure. Pertinent negatives include no diaphoresis, hoarse voice, shortness of breath or sore throat. (Body aches, runny nose) Treatments tried: Mucinex nasal spray, tylenol severe cold and sinus, nyquil, robitussin, hot tea with honey and ginger. The treatment provided no relief.    Seen virtually on 02/22/23 and had similar symptoms, diagnosed viral URI.   Problems:  Patient Active Problem List   Diagnosis Date Noted   Vapes nicotine containing substance 04/17/2022   Anxiety and depression 04/17/2022   COVID-19 04/17/2022   Vaccine counseling 04/17/2022   Chronic right shoulder pain 03/22/2022   HTN (hypertension) 09/05/2018   AKI (acute kidney injury) (HCC) 07/10/2016   Family history of colon cancer 06/21/2016  CRI (chronic renal insufficiency), stage 1 01/11/2016   Encounter for medical examination to establish care 09/09/2014   Elevated blood pressure 05/20/2014   HIV disease (HCC) 10/08/2013   HEADACHE, TENSION 11/09/2006   RHINITIS, ALLERGIC NEC 11/09/2006    Allergies:  Allergies  Allergen Reactions   Sulfonamide  Derivatives     unknown   Medications:  Current Outpatient Medications:    amoxicillin-clavulanate (AUGMENTIN) 875-125 MG tablet, Take 1 tablet by mouth 2 (two) times daily., Disp: 14 tablet, Rfl: 0   dolutegravir-lamiVUDine (DOVATO) 50-300 MG tablet, Take 1 tablet by mouth daily., Disp: 30 tablet, Rfl: 11   Multiple Vitamin (MULTI-VITAMINS) TABS, Take by mouth., Disp: , Rfl:    ondansetron (ZOFRAN-ODT) 4 MG disintegrating tablet, Take 1 tablet (4 mg total) by mouth every 8 (eight) hours as needed for nausea or vomiting., Disp: 10 tablet, Rfl: 0  Observations/Objective: Patient is well-developed, well-nourished in no acute distress.  Resting comfortably at home.  Head is normocephalic, atraumatic.  No labored breathing.  Speech is clear and coherent with logical content.  Patient is alert and oriented at baseline.    Assessment and Plan: 1. Acute bacterial sinusitis (Primary) - amoxicillin-clavulanate (AUGMENTIN) 875-125 MG tablet; Take 1 tablet by mouth 2 (two) times daily.  Dispense: 14 tablet; Refill: 0  - Worsening symptoms that have not responded to OTC medications.  - Suspect second sickening - Will give Augmentin - Continue allergy medications.  - Steam and humidifier can help - Stay well hydrated and get plenty of rest.  - Seek in person evaluation if no symptom improvement or if symptoms worsen   Follow Up Instructions: I discussed the assessment and treatment plan with the patient. The patient was provided an opportunity to ask questions and all were answered. The patient agreed with the plan and demonstrated an understanding of the instructions.  A copy of instructions were sent to the patient via MyChart unless otherwise noted below.    The patient was advised to call back or seek an in-person evaluation if the symptoms worsen or if the condition fails to improve as anticipated.    [name], PA-C

## 2023-03-16 NOTE — Patient Instructions (Signed)
Darrell [name], thank you for joining [name], PA-C for today's virtual visit.  While this provider is not your primary care provider (PCP), if your PCP is located in our provider database this encounter information will be shared with them immediately following your visit.   A [place] MyChart account gives you access to today's visit and all your visits, tests, and labs performed at [facility] " click here if you don't have a [facility] MyChart account or go to [email or web address]  Consent: (Patient) Darrell [name] provided verbal consent for this virtual visit at the beginning of the encounter.  Current Medications:  Current Outpatient Medications:    amoxicillin-clavulanate (AUGMENTIN) 875-125 MG tablet, Take 1 tablet by mouth 2 (two) times daily., Disp: 14 tablet, Rfl: 0   dolutegravir-lamiVUDine (DOVATO) 50-300 MG tablet, Take 1 tablet by mouth daily., Disp: 30 tablet, Rfl: 11   Multiple Vitamin (MULTI-VITAMINS) TABS, Take by mouth., Disp: , Rfl:    ondansetron (ZOFRAN-ODT) 4 MG disintegrating tablet, Take 1 tablet (4 mg total) by mouth every 8 (eight) hours as needed for nausea or vomiting., Disp: 10 tablet, Rfl: 0   Medications ordered in this encounter:  Meds ordered this encounter  Medications   amoxicillin-clavulanate (AUGMENTIN) 875-125 MG tablet    Sig: Take 1 tablet by mouth 2 (two) times daily.    Dispense:  14 tablet    Refill:  0    Supervising Provider:   Merrilee Jansky [[id number]]     *If you need refills on other medications prior to your next appointment, please contact your pharmacy*  Follow-Up: Call back or seek an in-person evaluation if the symptoms worsen or if the condition fails to improve as anticipated.  [facility] [phone]  Other Instructions Sinus Infection, Adult A sinus infection, also called sinusitis, is inflammation of your sinuses. Sinuses are hollow spaces in the bones around your  face. Your sinuses are located: Around your eyes. In the middle of your forehead. Behind your nose. In your cheekbones. Mucus normally drains out of your sinuses. When your nasal tissues become inflamed or swollen, mucus can become trapped or blocked. This allows bacteria, viruses, and fungi to grow, which leads to infection. Most infections of the sinuses are caused by a virus. A sinus infection can develop quickly. It can last for up to 4 weeks (acute) or for more than 12 weeks (chronic). A sinus infection often develops after a cold. What are the causes? This condition is caused by anything that creates swelling in the sinuses or stops mucus from draining. This includes: Allergies. Asthma. Infection from bacteria or viruses. Deformities or blockages in your nose or sinuses. Abnormal growths in the nose (nasal polyps). Pollutants, such as chemicals or irritants in the air. Infection from fungi. This is rare. What increases the risk? You are more likely to develop this condition if you: Have a weak body defense system (immune system). Do a lot of swimming or diving. Overuse nasal sprays. Smoke. What are the signs or symptoms? The main symptoms of this condition are pain and a feeling of pressure around the affected sinuses. Other symptoms include: Stuffy nose or congestion that makes it difficult to breathe through your nose. Thick yellow or greenish drainage from your nose. Tenderness, swelling, and warmth over the affected sinuses. A cough that may get worse at night. Decreased sense of smell and taste. Extra mucus that collects in the throat or the back of the nose (postnasal  drip) causing a sore throat or bad breath. Tiredness (fatigue). Fever. How is this diagnosed? This condition is diagnosed based on: Your symptoms. Your medical history. A physical exam. Tests to find out if your condition is acute or chronic. This may include: Checking your nose for nasal  polyps. Viewing your sinuses using a device that has a light (endoscope). Testing for allergies or bacteria. Imaging tests, such as an MRI or CT scan. In rare cases, a bone biopsy may be done to rule out more serious types of fungal sinus disease. How is this treated? Treatment for a sinus infection depends on the cause and whether your condition is chronic or acute. If caused by a virus, your symptoms should go away on their own within 10 days. You may be given medicines to relieve symptoms. They include: Medicines that shrink swollen nasal passages (decongestants). A spray that eases inflammation of the nostrils (topical intranasal corticosteroids). Rinses that help get rid of thick mucus in your nose (nasal saline washes). Medicines that treat allergies (antihistamines). Over-the-counter pain relievers. If caused by bacteria, your health care provider may recommend waiting to see if your symptoms improve. Most bacterial infections will get better without antibiotic medicine. You may be given antibiotics if you have: A severe infection. A weak immune system. If caused by narrow nasal passages or nasal polyps, surgery may be needed. Follow these instructions at home: Medicines Take, use, or apply over-the-counter and prescription medicines only as told by your health care provider. These may include nasal sprays. If you were prescribed an antibiotic medicine, take it as told by your health care provider. Do not stop taking the antibiotic even if you start to feel better. Hydrate and humidify  Drink enough fluid to keep your urine pale yellow. Staying hydrated will help to thin your mucus. Use a cool mist humidifier to keep the humidity level in your home above 50%. Inhale steam for 10-15 minutes, 3-4 times a day, or as told by your health care provider. You can do this in the bathroom while a hot shower is running. Limit your exposure to cool or dry air. Rest Rest as much as  possible. Sleep with your head raised (elevated). Make sure you get enough sleep each night. General instructions  Apply a warm, moist washcloth to your face 3-4 times a day or as told by your health care provider. This will help with discomfort. Use nasal saline washes as often as told by your health care provider. Wash your hands often with soap and water to reduce your exposure to germs. If soap and water are not available, use hand sanitizer. Do not smoke. Avoid being around people who are smoking (secondhand smoke). Keep all follow-up visits. This is important. Contact a health care provider if: You have a fever. Your symptoms get worse. Your symptoms do not improve within 10 days. Get help right away if: You have a severe headache. You have persistent vomiting. You have severe pain or swelling around your face or eyes. You have vision problems. You develop confusion. Your neck is stiff. You have trouble breathing. These symptoms may be an emergency. Get help right away. Call 911. Do not wait to see if the symptoms will go away. Do not drive yourself to the hospital. Summary A sinus infection is soreness and inflammation of your sinuses. Sinuses are hollow spaces in the bones around your face. This condition is caused by nasal tissues that become inflamed or swollen. The swelling traps or blocks  the flow of mucus. This allows bacteria, viruses, and fungi to grow, which leads to infection. If you were prescribed an antibiotic medicine, take it as told by your health care provider. Do not stop taking the antibiotic even if you start to feel better. Keep all follow-up visits. This is important. This information is not intended to replace advice given to you by your health care provider. Make sure you discuss any questions you have with your health care provider. Document Revised: 01/11/2021 Document Reviewed: 01/11/2021 Elsevier Patient Education  2024 Elsevier Inc.   If you  have been instructed to have an in-person evaluation today at a local Urgent Care facility, please use the link below. It will take you to a list of all of our available [facility], including address, phone number and hours of operation. Please do not delay care.  [facility]  If you or a family member do not have a primary care provider, use the link below to schedule a visit and establish care. When you choose a [facility] primary care physician or advanced practice provider, you gain a long-term partner in health. Find a Primary Care Provider  Learn more about [facility]'s in-office and virtual care options: [facility] - Get Care Now

## 2023-03-20 ENCOUNTER — Ambulatory Visit
Admission: EM | Admit: 2023-03-20 | Discharge: 2023-03-20 | Disposition: A | Discharge status: Home / Self Care | Payer: Federal, State, Local not specified - PPO | Attending: Family Medicine | Admitting: Family Medicine

## 2023-03-20 ENCOUNTER — Ambulatory Visit (HOSPITAL_BASED_OUTPATIENT_CLINIC_OR_DEPARTMENT_OTHER): Payer: Federal, State, Local not specified - PPO

## 2023-03-20 DIAGNOSIS — J40 Bronchitis, not specified as acute or chronic: Secondary | ICD-10-CM | POA: Diagnosis not present

## 2023-03-20 DIAGNOSIS — J329 Chronic sinusitis, unspecified: Secondary | ICD-10-CM | POA: Insufficient documentation

## 2023-03-20 DIAGNOSIS — R0602 Shortness of breath: Secondary | ICD-10-CM | POA: Diagnosis not present

## 2023-03-20 LAB — POC COVID19/FLU A&B COMBO
Covid Antigen, POC: NEGATIVE
Influenza A Antigen, POC: NEGATIVE
Influenza B Antigen, POC: NEGATIVE

## 2023-03-20 MED ORDER — ACETAMINOPHEN 325 MG PO TABS
650.0000 mg | ORAL_TABLET | Freq: Once | ORAL | Status: AC
  Administered 2023-03-20: 650 mg via ORAL

## 2023-03-20 MED ORDER — PROMETHAZINE-DM 6.25-15 MG/5ML PO SYRP: 5.0000 mL | ORAL_SOLUTION | Freq: Three times a day (TID) | ORAL | 0 refills | Status: DC | PRN

## 2023-03-20 MED ORDER — PREDNISONE 20 MG PO TABS: ORAL_TABLET | ORAL | 0 refills | Status: DC

## 2023-03-20 NOTE — ED Triage Notes (Signed)
Patient states fever, chest congestion and malaise x 2 days. Patient is currently on Amoxicillin however feels this is not working.

## 2023-03-20 NOTE — ED Provider Notes (Signed)
[facility] URGENT [facility]  Note:  This document was prepared using Conservation officer, historic buildings and may include unintentional dictation errors.  MRN: [id number] DOB: [birth year]  Subjective:   Darrell [name] is a 33 y.o. male presenting for 2-day history of acute onset fevers, chest congestion, malaise, body pains.  Had a video visit 03/16/2023 and was treated for acute bacterial sinusitis as he had had sinus symptoms for 2 weeks.  Has been taking Augmentin but is making no changes in his symptoms.  No history of asthma.  Smokes marijuana regularly.  Also vapes on occasion.  Has a history of HIV disease and is on Dovato.  No current facility-administered medications for this encounter.  Current Outpatient Medications:    amoxicillin-clavulanate (AUGMENTIN) 875-125 MG tablet, Take 1 tablet by mouth 2 (two) times daily., Disp: 14 tablet, Rfl: 0   dolutegravir-lamiVUDine (DOVATO) 50-300 MG tablet, Take 1 tablet by mouth daily., Disp: 30 tablet, Rfl: 11   Multiple Vitamin (MULTI-VITAMINS) TABS, Take by mouth., Disp: , Rfl:    ondansetron (ZOFRAN-ODT) 4 MG disintegrating tablet, Take 1 tablet (4 mg total) by mouth every 8 (eight) hours as needed for nausea or vomiting., Disp: 10 tablet, Rfl: 0   Allergies  Allergen Reactions   Sulfonamide Derivatives     unknown    Past Medical History:  Diagnosis Date   AKI (acute kidney injury) (HCC) 07/10/2016   Anxiety and depression 04/17/2022   COVID-19 04/17/2022   HIV infection (HCC)    HTN (hypertension) 09/05/2018   Screen for STD (sexually transmitted disease) 09/09/2014   Smoker 11/23/2014   Vaccine counseling 04/17/2022   Vapes nicotine containing substance 04/17/2022     Past Surgical History:  Procedure Laterality Date   HIGH RESOLUTION ANOSCOPY N/A 01/17/2023   Procedure: HIGH RESOLUTION ANOSCOPY;  Surgeon: Romie Levee, MD;  [facility];  Service: General;  Laterality: N/A;   RECTAL  BIOPSY N/A 01/17/2023   Procedure: BIOPSY RECTAL AND ABLATION;  Surgeon: Romie Levee, MD;  Location: [facility];  Service: General;  Laterality: N/A;    Family History  Problem Relation Age of Onset   Cancer Mother    Fibromyalgia Mother    Hypertension Mother     Social History   Tobacco Use   Smoking status: Light Smoker    Types: E-cigarettes   Smokeless tobacco: Never   Tobacco comments:    Daily  Vaping Use   Vaping status: Some Days  Substance Use Topics   Alcohol use: Yes    Comment: socially   Drug use: Yes    Frequency: 7.0 times per week    Types: Marijuana    Comment: socially    ROS   Objective:   Vitals: BP 118/79 (BP Location: Left Arm)   Pulse 92   Temp (!) 102.2 F (39 C) (Oral)   Resp 18   SpO2 95%   Physical Exam Constitutional:      General: He is not in acute distress.    Appearance: Normal appearance. He is well-developed and normal weight. He is not ill-appearing, toxic-appearing or diaphoretic.  HENT:     Head: Normocephalic and atraumatic.     Right Ear: Tympanic membrane, ear canal and external ear normal. No drainage, swelling or tenderness. No middle ear effusion. There is no impacted cerumen. Tympanic membrane is not erythematous or bulging.     Left Ear: Tympanic membrane, ear canal and external ear normal. No drainage, swelling  or tenderness.  No middle ear effusion. There is no impacted cerumen. Tympanic membrane is not erythematous or bulging.     Nose: Nose normal. No congestion or rhinorrhea.     Mouth/Throat:     Mouth: Mucous membranes are moist.     Pharynx: No oropharyngeal exudate or posterior oropharyngeal erythema.  Eyes:     General: No scleral icterus.       Right eye: No discharge.        Left eye: No discharge.     Extraocular Movements: Extraocular movements intact.     Conjunctiva/sclera: Conjunctivae normal.  Cardiovascular:     Rate and Rhythm: Normal rate and regular rhythm.     Heart  sounds: Normal heart sounds. No murmur heard.    No friction rub. No gallop.  Pulmonary:     Effort: Pulmonary effort is normal. No respiratory distress.     Breath sounds: Normal breath sounds. No stridor. No wheezing, rhonchi or rales.  Musculoskeletal:     Cervical back: Normal range of motion and neck supple. No rigidity. No muscular tenderness.  Neurological:     General: No focal deficit present.     Mental Status: He is alert and oriented to person, place, and time.  Psychiatric:        Mood and Affect: Mood normal.        Behavior: Behavior normal.        Thought Content: Thought content normal.     Results for orders placed or performed during the hospital encounter of 03/20/23 (from the past 24 hours)  POC Covid19/Flu A&B Antigen     Status: Normal   Collection Time: 03/20/23  9:18 AM  Result Value Ref Range   Influenza A Antigen, POC Negative    Influenza B Antigen, POC Negative    Covid Antigen, POC Negative    DG Chest 2 View Result Date: 03/20/2023 CLINICAL DATA:  Shortness of breath.  Rule out pneumonia. EXAM: CHEST - 2 VIEW COMPARISON:  Chest radiograph dated 08/02/2015. FINDINGS: The heart size and mediastinal contours are within normal limits. Both lungs are clear. The visualized skeletal structures are unremarkable. IMPRESSION: No active cardiopulmonary disease. Electronically Signed   By: Elgie Collard M.D.   On: 03/20/2023 10:26     Assessment and Plan :   PDMP not reviewed this encounter.  1. Sinobronchitis    Recommended a oral prednisone course to help manage sinobronchitis.  Continue Augmentin.  Chest x-ray negative, COVID flu testing negative.  Maintain all other medications.  Counseled patient on potential for adverse effects with medications prescribed/recommended today, ER and return-to-clinic precautions discussed, patient verbalized understanding.    Wallis Bamberg, [place] 03/20/23 1610

## 2023-03-20 NOTE — Discharge Instructions (Addendum)
I have placed orders to have an x-ray done at the med center in [facility].  Please had there now.  Go through the main hospital and not the emergency room.  Once you are there and let them know that you will came to our clinic and we send she to their facility for an outpatient x-ray.  If no one is at the front desk then they are likely out the rest of the day and at that point you would have to go through the emergency room.  Do not check in as a patient through the emergency room.  Simply let them know that you are there for an outpatient x-ray from our clinic.  I will call you with your results and update our treatment plan if necessary after I get the report.  Please wait to go pick up your prescriptions for any medications I have prescribed for you until after we discussed your x-ray results.   Keep taking Augmentin. Add in prednisone for sinobronchitis. Use cough syrup as needed. Schedule Tylenol to help with your fevers. Will update you on results later today.

## 2023-04-15 ENCOUNTER — Encounter: Payer: Self-pay | Admitting: Infectious Disease

## 2023-04-15 DIAGNOSIS — R85612 Low grade squamous intraepithelial lesion on cytologic smear of anus (LGSIL): Secondary | ICD-10-CM | POA: Insufficient documentation

## 2023-04-15 DIAGNOSIS — R85610 Atypical squamous cells of undetermined significance on cytologic smear of anus (ASC-US): Secondary | ICD-10-CM | POA: Insufficient documentation

## 2023-04-15 HISTORY — DX: Atypical squamous cells of undetermined significance on cytologic smear of anus (ASC-US): R85.610

## 2023-04-15 HISTORY — DX: Low grade squamous intraepithelial lesion on cytologic smear of anus (LGSIL): R85.612

## 2023-04-16 ENCOUNTER — Ambulatory Visit: Payer: Federal, State, Local not specified - PPO | Admitting: Infectious Disease

## 2023-04-16 DIAGNOSIS — R85612 Low grade squamous intraepithelial lesion on cytologic smear of anus (LGSIL): Secondary | ICD-10-CM

## 2023-04-16 DIAGNOSIS — B20 Human immunodeficiency virus [HIV] disease: Secondary | ICD-10-CM

## 2023-04-16 DIAGNOSIS — Z7185 Encounter for immunization safety counseling: Secondary | ICD-10-CM

## 2023-04-16 DIAGNOSIS — Z720 Tobacco use: Secondary | ICD-10-CM

## 2023-04-16 DIAGNOSIS — R85610 Atypical squamous cells of undetermined significance on cytologic smear of anus (ASC-US): Secondary | ICD-10-CM

## 2023-05-01 ENCOUNTER — Telehealth: Admitting: Physician Assistant

## 2023-05-01 NOTE — Progress Notes (Signed)
 The patient no-showed for appointment despite this provider sending direct link with no response and waiting for at least 10 minutes from appointment time for patient to join. They will be marked as a NS for this appointment/time.   Piedad Climes, PA-C

## 2023-05-05 ENCOUNTER — Telehealth: Admitting: Nurse Practitioner

## 2023-05-05 DIAGNOSIS — K2900 Acute gastritis without bleeding: Secondary | ICD-10-CM | POA: Diagnosis not present

## 2023-05-05 DIAGNOSIS — G44201 Tension-type headache, unspecified, intractable: Secondary | ICD-10-CM

## 2023-05-05 MED ORDER — ONDANSETRON 4 MG PO TBDP: 4.0000 mg | ORAL_TABLET | Freq: Three times a day (TID) | ORAL | 0 refills | Status: DC | PRN

## 2023-05-05 MED ORDER — IBUPROFEN 600 MG PO TABS: 600.0000 mg | ORAL_TABLET | Freq: Three times a day (TID) | ORAL | 0 refills | Status: DC | PRN

## 2023-05-05 NOTE — Patient Instructions (Addendum)
 Behavioral Health Resources:   What if I or someone I know is in crisis?  If you are thinking about harming yourself or having thoughts of suicide, or if you know someone who is, seek help right away.  Call your doctor or mental health care provider.  Call 911 or go to a hospital emergency room to get immediate help, or ask a friend or family member to help you do these things.  Call the [place] National Suicide Prevention Lifeline's toll-free, 24-hour hotline at 1-800-273-TALK 424-804-3516) or TTY: 1-800-799-4 TTY 747 753 9105) to talk to a trained counselor.  If you are in crisis, make sure you are not left alone.   If someone else is in crisis, make sure he or she is not left alone   24 Hour Availability  [facility]  [address] [place], [place] [address]  [phone] or [phone]  [facility] of the [employer] (Domestic Violence, Rape & Victim Assistance 703-667-3896  [facility]  [address], [place]  [address]               [phone] or [phone]  [facility] [place] [facility]    (ONLY from 8am-4pm)    8503520274  [facility] (24/7)   [phone]  [place] National Suicide Hotline   802 059 6489 Len Childs)  [facility] Address: [address], [place], [place] [address] Phone: [phone]       ONGOING BEHAVIORAL HEALTH SUPPORT FOR UNINSURED and UNDERINSURED:  [facility]  [phone]  [address]  Walk-in first time, Monday-Friday, 8:30am-5:00pm  *Bring snack, drink, something to do, long wait at first visit, they do have pharmacy for behavioral health medications/ Bring own interpreter at first visit, if needed [employer] of the [employer]  [phone]  [address]  Walk-in Monday-Friday, 8:30am-12pm & 1-2:30pm  *pacientes que hablen espanol, favor comunicarse con el [place],  extension 2244 o la [address], extension 3331 para hacer [facility]:  681-540-7489 or [email or web address] .[email or web address]  [address]  Call or email, may self-refer  * uninsured/underinsured, [phone], have both mental health and substance use challenges  [facility]:  Phone [phone]; Fax [phone]  *Call to schedule an appointment  3rd Floor located @?[address], corner of [address]  Mon-Thursday: 8:30am-8:00pm Friday: 8:30am-7:00pm  * Be sure to park in a space labeled "Psychology Department," located to the right of the main door of the building. Enter the main doors facing the parking lot and take the elevator or stairs to the 3rd Floor.  [facility]:  [phone] or  1-629-147-3619 (24/hour helpline)  [address]  Call to make appointment, tends to be a long wait to begin services, depending on insurance  [facility]  [phone] ??  *Call to schedule an appointment  [address]  Monday-Friday, 8:00am-5:00pm  [facility]  [phone] [address], [place]  Monday-Friday, walk-in 8am-3pm  First appointment is assessment, then will make appointment for psychiatry

## 2023-05-05 NOTE — Progress Notes (Signed)
 Virtual Visit Consent   Darrell [name], you are scheduled for a virtual visit with a [facility] provider today. Just as with appointments in the office, your consent must be obtained to participate. Your consent will be active for this visit and any virtual visit you may have with one of our providers in the next 365 days. If you have a MyChart account, a copy of this consent can be sent to you electronically.  As this is a virtual visit, video technology does not allow for your provider to perform a traditional examination. This may limit your provider's ability to fully assess your condition. If your provider identifies any concerns that need to be evaluated in person or the need to arrange testing (such as labs, EKG, etc.), we will make arrangements to do so. Although advances in technology are sophisticated, we cannot ensure that it will always work on either your end or our end. If the connection with a video visit is poor, the visit may have to be switched to a telephone visit. With either a video or telephone visit, we are not always able to ensure that we have a secure connection.  By engaging in this virtual visit, you consent to the provision of healthcare and authorize for your insurance to be billed (if applicable) for the services provided during this visit. Depending on your insurance coverage, you may receive a charge related to this service.  I need to obtain your verbal consent now. Are you willing to proceed with your visit today? XAVI TOMASIK has provided verbal consent on 05/05/2023 for a virtual visit (video or telephone). Claiborne Rigg, NP  Date: 05/05/2023 4:35 PM   Virtual Visit via Video Note   I, Claiborne Rigg, connected with  Darrell [name]  ([id number], 1990/06/02) on 05/05/23 at  4:30 PM EDT by a video-enabled telemedicine application and verified that I am speaking with the correct person using two identifiers.  Location: Patient: Virtual Visit Location  Patient: Home Provider: Virtual Visit Location Provider: Home Office   I discussed the limitations of evaluation and management by telemedicine and the availability of in person appointments. The patient expressed understanding and agreed to proceed.   ffhf History of Present Illness: Darrell [name] is a 33 y.o. who identifies as a male who was assigned male at birth, and is being seen today for headache and N/V.  Mr Klasen states he has been dealing with a lot of issues lately. Having car trouble, his mom is dealing with health issues and he has been experiencing financial problems as well. He is currently experiencing a headache and had an episode of vomiting as well. He does not have any thoughts of self harm but states he thinks he may be dealing with anxiety.    Problems:  Patient Active Problem List   Diagnosis Date Noted   Pap smear of anus with ASCUS 04/15/2023   Low grade intrepith lesion cyto smr anus (LGSIL) 04/15/2023   Vapes nicotine containing substance 04/17/2022   Anxiety and depression 04/17/2022   COVID-19 04/17/2022   Vaccine counseling 04/17/2022   Chronic right shoulder pain 03/22/2022   HTN (hypertension) 09/05/2018   AKI (acute kidney injury) (HCC) 07/10/2016   Family history of colon cancer 06/21/2016   CRI (chronic renal insufficiency), stage 1 01/11/2016   Encounter for medical examination to establish care 09/09/2014   Elevated blood pressure 05/20/2014   HIV disease (HCC) 10/08/2013   HEADACHE, TENSION 11/09/2006  RHINITIS, ALLERGIC NEC 11/09/2006    Allergies:  Allergies  Allergen Reactions   Sulfonamide Derivatives     unknown   Medications:  Current Outpatient Medications:    ibuprofen (ADVIL) 600 MG tablet, Take 1 tablet (600 mg total) by mouth every 8 (eight) hours as needed., Disp: 30 tablet, Rfl: 0   amoxicillin-clavulanate (AUGMENTIN) 875-125 MG tablet, Take 1 tablet by mouth 2 (two) times daily., Disp: 14 tablet, Rfl: 0    dolutegravir-lamiVUDine (DOVATO) 50-300 MG tablet, Take 1 tablet by mouth daily., Disp: 30 tablet, Rfl: 11   Multiple Vitamin (MULTI-VITAMINS) TABS, Take by mouth., Disp: , Rfl:    ondansetron (ZOFRAN-ODT) 4 MG disintegrating tablet, Take 1 tablet (4 mg total) by mouth every 8 (eight) hours as needed for nausea or vomiting., Disp: 10 tablet, Rfl: 0   predniSONE (DELTASONE) 20 MG tablet, Take 2 tablets daily with breakfast., Disp: 10 tablet, Rfl: 0   promethazine-dextromethorphan (PROMETHAZINE-DM) 6.25-15 MG/5ML syrup, Take 5 mLs by mouth 3 (three) times daily as needed for cough., Disp: 200 mL, Rfl: 0  Observations/Objective: Patient is well-developed, well-nourished in no acute distress.  Resting comfortably at home.  Head is normocephalic, atraumatic.  No labored breathing.  Speech is clear and coherent with logical content.  Patient is alert and oriented at baseline.    Assessment and Plan: 1. Acute gastritis without hemorrhage, unspecified gastritis type - ondansetron (ZOFRAN-ODT) 4 MG disintegrating tablet; Take 1 tablet (4 mg total) by mouth every 8 (eight) hours as needed for nausea or vomiting.  Dispense: 10 tablet; Refill: 0  2. Acute intractable tension-type headache (Primary) - ibuprofen (ADVIL) 600 MG tablet; Take 1 tablet (600 mg total) by mouth every 8 (eight) hours as needed.  Dispense: 30 tablet; Refill: 0    Follow Up Instructions: I discussed the assessment and treatment plan with the patient. The patient was provided an opportunity to ask questions and all were answered. The patient agreed with the plan and demonstrated an understanding of the instructions.  A copy of instructions were sent to the patient via MyChart unless otherwise noted below.    The patient was advised to call back or seek an in-person evaluation if the symptoms worsen or if the condition fails to improve as anticipated.    Claiborne Rigg, NP

## 2023-05-14 ENCOUNTER — Other Ambulatory Visit: Payer: Self-pay

## 2023-05-14 ENCOUNTER — Encounter: Payer: Self-pay | Admitting: Infectious Disease

## 2023-05-14 ENCOUNTER — Other Ambulatory Visit (HOSPITAL_COMMUNITY)
Admission: RE | Admit: 2023-05-14 | Discharge: 2023-05-14 | Disposition: A | Discharge status: Home / Self Care | Source: Ambulatory Visit | Attending: Infectious Disease | Admitting: Infectious Disease

## 2023-05-14 ENCOUNTER — Ambulatory Visit (INDEPENDENT_AMBULATORY_CARE_PROVIDER_SITE_OTHER): Payer: Federal, State, Local not specified - PPO | Admitting: Infectious Disease

## 2023-05-14 VITALS — BP 124/84 | HR 74 | Temp 98.1°F | Ht 71.0 in | Wt 190.0 lb

## 2023-05-14 DIAGNOSIS — J069 Acute upper respiratory infection, unspecified: Secondary | ICD-10-CM

## 2023-05-14 DIAGNOSIS — F32A Depression, unspecified: Secondary | ICD-10-CM

## 2023-05-14 DIAGNOSIS — R85612 Low grade squamous intraepithelial lesion on cytologic smear of anus (LGSIL): Secondary | ICD-10-CM

## 2023-05-14 DIAGNOSIS — F419 Anxiety disorder, unspecified: Secondary | ICD-10-CM | POA: Diagnosis not present

## 2023-05-14 DIAGNOSIS — B20 Human immunodeficiency virus [HIV] disease: Secondary | ICD-10-CM

## 2023-05-14 MED ORDER — DOVATO 50-300 MG PO TABS: 1.0000 | ORAL_TABLET | Freq: Every day | ORAL | 11 refills | Status: DC

## 2023-05-14 MED ORDER — DOXYCYCLINE HYCLATE 100 MG PO TABS: ORAL_TABLET | ORAL | 5 refills | Status: AC

## 2023-05-14 MED ORDER — DOXYCYCLINE HYCLATE 100 MG PO TABS
ORAL_TABLET | ORAL | 5 refills | Status: DC
Start: 2023-05-14 — End: 2023-05-14

## 2023-05-14 NOTE — Progress Notes (Signed)
 Subjective:  Chief complaint: follow-up for HIV disease on medications   Patient ID: Darrell [name], male    DOB: Dec 21, 1990, 33 y.o.   MRN: [id number]  HPI  Discussed the use of AI scribe software for clinical note transcription with the patient, who gave verbal consent to proceed.  History of Present Illness   The patient, with a history of HIV and anal dysplasia, presents for routine care and expresses a desire to find a therapist. He is currently dealing with a recent break up and the ongoing care of his mother. He has narrowed his search down to one or two potential therapists.  He recently had a consultation with a different provider for gastritis and migraine headaches. He also had a recent illness, which he attributes to the changing weather and sinus issues.  The patient resides in [facility] and recently had an anoscopy with Dr. Maisie Fus due to abnormal cells. Post-procedure, he was told that everything looked good and he did not need to return for follow-up.  He is currently on Dovato for his HIV and is very happy with this medication. He is also interested in STI screening and post-exposure prophylaxis with doxycycline.       Past Medical History:  Diagnosis Date   AKI (acute kidney injury) (HCC) 07/10/2016   Anxiety and depression 04/17/2022   COVID-19 04/17/2022   HIV infection (HCC)    HTN (hypertension) 09/05/2018   Low grade intrepith lesion cyto smr anus (LGSIL) 04/15/2023   Pap smear of anus with ASCUS 04/15/2023   Screen for STD (sexually transmitted disease) 09/09/2014   Smoker 11/23/2014   Vaccine counseling 04/17/2022   Vapes nicotine containing substance 04/17/2022    Past Surgical History:  Procedure Laterality Date   HIGH RESOLUTION ANOSCOPY N/A 01/17/2023   Procedure: HIGH RESOLUTION ANOSCOPY;  Surgeon: Romie Levee, MD;  Location: [facility];  Service: General;  Laterality: N/A;   RECTAL BIOPSY N/A 01/17/2023   Procedure: BIOPSY  RECTAL AND ABLATION;  Surgeon: Romie Levee, MD;  Location: [facility];  Service: General;  Laterality: N/A;    Family History  Problem Relation Age of Onset   Cancer Mother    Fibromyalgia Mother    Hypertension Mother       Social History   Socioeconomic History   Marital status: Single    Spouse name: Not on file   Number of children: Not on file   Years of education: Not on file   Highest education level: Not on file  Occupational History   Not on file  Tobacco Use   Smoking status: Light Smoker    Types: E-cigarettes   Smokeless tobacco: Never   Tobacco comments:    Daily  Vaping Use   Vaping status: Some Days  Substance and Sexual Activity   Alcohol use: Yes    Comment: socially   Drug use: Yes    Frequency: 7.0 times per week    Types: Marijuana    Comment: socially   Sexual activity: Yes    Partners: Male    Birth control/protection: Condom    Comment: declined condoms  Other Topics Concern   Not on file  Social History Narrative   Not on file   Social Drivers of Health   Financial Resource Strain: Not on file  Food Insecurity: Not on file  Transportation Needs: Not on file  Physical Activity: Not on file  Stress: Not on file  Social Connections: Not on  file    Allergies  Allergen Reactions   Sulfonamide Derivatives     unknown     Current Outpatient Medications:    dolutegravir-lamiVUDine (DOVATO) 50-300 MG tablet, Take 1 tablet by mouth daily., Disp: 30 tablet, Rfl: 11   Multiple Vitamin (MULTI-VITAMINS) TABS, Take by mouth., Disp: , Rfl:    amoxicillin-clavulanate (AUGMENTIN) 875-125 MG tablet, Take 1 tablet by mouth 2 (two) times daily. (Patient not taking: Reported on 05/14/2023), Disp: 14 tablet, Rfl: 0   ibuprofen (ADVIL) 600 MG tablet, Take 1 tablet (600 mg total) by mouth every 8 (eight) hours as needed. (Patient not taking: Reported on 05/14/2023), Disp: 30 tablet, Rfl: 0   ondansetron (ZOFRAN-ODT) 4 MG  disintegrating tablet, Take 1 tablet (4 mg total) by mouth every 8 (eight) hours as needed for nausea or vomiting. (Patient not taking: Reported on 05/14/2023), Disp: 10 tablet, Rfl: 0   predniSONE (DELTASONE) 20 MG tablet, Take 2 tablets daily with breakfast. (Patient not taking: Reported on 05/14/2023), Disp: 10 tablet, Rfl: 0   promethazine-dextromethorphan (PROMETHAZINE-DM) 6.25-15 MG/5ML syrup, Take 5 mLs by mouth 3 (three) times daily as needed for cough. (Patient not taking: Reported on 05/14/2023), Disp: 200 mL, Rfl: 0    Review of Systems  Constitutional:  Negative for activity change, appetite change, chills, diaphoresis, fatigue, fever and unexpected weight change.  HENT:  Negative for congestion, rhinorrhea, sinus pressure, sneezing, sore throat and trouble swallowing.   Eyes:  Negative for photophobia and visual disturbance.  Respiratory:  Negative for cough, chest tightness, shortness of breath, wheezing and stridor.   Cardiovascular:  Negative for chest pain, palpitations and leg swelling.  Gastrointestinal:  Negative for abdominal distention, abdominal pain, anal bleeding, blood in stool, constipation, diarrhea, nausea and vomiting.  Genitourinary:  Negative for difficulty urinating, dysuria, flank pain and hematuria.  Musculoskeletal:  Negative for arthralgias, back pain, gait problem, joint swelling and myalgias.  Skin:  Negative for color change, pallor, rash and wound.  Neurological:  Negative for dizziness, tremors, weakness and light-headedness.  Hematological:  Negative for adenopathy. Does not bruise/bleed easily.  Psychiatric/Behavioral:  Positive for dysphoric mood. Negative for agitation, behavioral problems, confusion, decreased concentration and sleep disturbance. The patient is nervous/anxious.        Objective:   Physical Exam Constitutional:      Appearance: He is well-developed.  HENT:     Head: Normocephalic and atraumatic.  Eyes:     Conjunctiva/sclera:  Conjunctivae normal.  Cardiovascular:     Rate and Rhythm: Normal rate and regular rhythm.  Pulmonary:     Effort: Pulmonary effort is normal. No respiratory distress.     Breath sounds: No wheezing.  Abdominal:     General: There is no distension.     Palpations: Abdomen is soft.  Musculoskeletal:        General: No tenderness. Normal range of motion.     Cervical back: Normal range of motion and neck supple.  Skin:    General: Skin is warm and dry.     Coloration: Skin is not pale.     Findings: No erythema or rash.  Neurological:     General: No focal deficit present.     Mental Status: He is alert and oriented to person, place, and time.  Psychiatric:        Mood and Affect: Mood normal.        Behavior: Behavior normal.        Thought Content: Thought content normal.  Judgment: Judgment normal.           Assessment & Plan:   Assessment and Plan    HIV infection Currently on Dovato with satisfactory response. --check  HIV RNA, CD4 other labs   Sexually Transmitted Infection (STI) screening Discussed doxycycline for post-exposure prophylaxis against chlamydia and syphilis. Noted ineffectiveness against gonorrhea due to resistance. Beneficial for frequent syphilis or chlamydia infections. - Perform STI screening today. - Prescribe doxycycline for post-exposure prophylaxis to [facility] in [place].  Abnormal anal cytology Underwent anoscopy with Dr. Maisie Fus; abnormal cells treated. Advised annual anal Pap smears. - Repeat anal Pap smear in one year from last check  Mental health concerns Experiencing stress related to recent breakup and caregiving for mother. In process of finding a therapist and had recent consultation.  Follow-up Advised to return in six months to reassess condition and treatment efficacy. - Schedule follow-up appointment in six months.

## 2023-05-15 LAB — CYTOLOGY, (ORAL, ANAL, URETHRAL) ANCILLARY ONLY
Chlamydia: NEGATIVE
Chlamydia: NEGATIVE
Comment: NEGATIVE
Comment: NEGATIVE
Comment: NORMAL
Comment: NORMAL
Neisseria Gonorrhea: NEGATIVE
Neisseria Gonorrhea: NEGATIVE

## 2023-05-15 LAB — URINE CYTOLOGY ANCILLARY ONLY
Chlamydia: NEGATIVE
Comment: NEGATIVE
Comment: NORMAL
Neisseria Gonorrhea: NEGATIVE

## 2023-05-15 LAB — T-HELPER CELLS (CD4) COUNT (NOT AT ARMC)
CD4 % Helper T Cell: 45 % (ref 33–65)
CD4 T Cell Abs: 726 /uL (ref 400–1790)

## 2023-05-16 LAB — CBC WITH DIFFERENTIAL/PLATELET
Absolute Lymphocytes: 1754 {cells}/uL (ref 850–3900)
Absolute Monocytes: 375 {cells}/uL (ref 200–950)
Basophils Absolute: 21 {cells}/uL (ref 0–200)
Basophils Relative: 0.6 %
Eosinophils Absolute: 70 {cells}/uL (ref 15–500)
Eosinophils Relative: 2 %
HCT: 43.1 % (ref 38.5–50.0)
Hemoglobin: 14.9 g/dL (ref 13.2–17.1)
MCH: 30.9 pg (ref 27.0–33.0)
MCHC: 34.6 g/dL (ref 32.0–36.0)
MCV: 89.4 fL (ref 80.0–100.0)
MPV: 8.8 fL (ref 7.5–12.5)
Monocytes Relative: 10.7 %
Neutro Abs: 1281 {cells}/uL — ABNORMAL LOW (ref 1500–7800)
Neutrophils Relative %: 36.6 %
Platelets: 285 10*3/uL (ref 140–400)
RBC: 4.82 10*6/uL (ref 4.20–5.80)
RDW: 12.7 % (ref 11.0–15.0)
Total Lymphocyte: 50.1 %
WBC: 3.5 10*3/uL — ABNORMAL LOW (ref 3.8–10.8)

## 2023-05-16 LAB — HIV-1 RNA QUANT-NO REFLEX-BLD
HIV 1 RNA Quant: NOT DETECTED {copies}/mL
HIV-1 RNA Quant, Log: NOT DETECTED {Log_copies}/mL

## 2023-05-16 LAB — COMPLETE METABOLIC PANEL WITH GFR
AG Ratio: 1.8 (calc) (ref 1.0–2.5)
ALT: 16 U/L (ref 9–46)
AST: 17 U/L (ref 10–40)
Albumin: 4.2 g/dL (ref 3.6–5.1)
Alkaline phosphatase (APISO): 42 U/L (ref 36–130)
BUN/Creatinine Ratio: 6 (calc) (ref 6–22)
BUN: 8 mg/dL (ref 7–25)
CO2: 27 mmol/L (ref 20–32)
Calcium: 9.5 mg/dL (ref 8.6–10.3)
Chloride: 105 mmol/L (ref 98–110)
Creat: 1.28 mg/dL — ABNORMAL HIGH (ref 0.60–1.26)
Globulin: 2.4 g/dL (ref 1.9–3.7)
Glucose, Bld: 95 mg/dL (ref 65–99)
Potassium: 4.2 mmol/L (ref 3.5–5.3)
Sodium: 139 mmol/L (ref 135–146)
Total Bilirubin: 0.8 mg/dL (ref 0.2–1.2)
Total Protein: 6.6 g/dL (ref 6.1–8.1)

## 2023-05-16 LAB — RPR: RPR Ser Ql: NONREACTIVE

## 2023-05-16 LAB — LIPID PANEL
Cholesterol: 146 mg/dL (ref <200)
HDL: 62 mg/dL (ref >=40)
LDL Cholesterol (Calc): 69 mg/dL
Non-HDL Cholesterol (Calc): 84 mg/dL (ref <130)
Total CHOL/HDL Ratio: 2.4 (calc) (ref <5.0)
Triglycerides: 66 mg/dL (ref <150)

## 2023-06-15 ENCOUNTER — Telehealth: Admitting: Family Medicine

## 2023-06-15 DIAGNOSIS — R197 Diarrhea, unspecified: Secondary | ICD-10-CM

## 2023-06-15 DIAGNOSIS — R112 Nausea with vomiting, unspecified: Secondary | ICD-10-CM

## 2023-06-15 MED ORDER — ONDANSETRON 4 MG PO TBDP: 4.0000 mg | ORAL_TABLET | Freq: Three times a day (TID) | ORAL | 0 refills | Status: AC | PRN

## 2023-06-15 NOTE — Progress Notes (Signed)
 Virtual Visit Consent   Darrell [name], you are scheduled for a virtual visit with a [facility] provider today. Just as with appointments in the office, your consent must be obtained to participate. Your consent will be active for this visit and any virtual visit you may have with one of our providers in the next 365 days. If you have a MyChart account, a copy of this consent can be sent to you electronically.  As this is a virtual visit, video technology does not allow for your provider to perform a traditional examination. This may limit your provider's ability to fully assess your condition. If your provider identifies any concerns that need to be evaluated in person or the need to arrange testing (such as labs, EKG, etc.), we will make arrangements to do so. Although advances in technology are sophisticated, we cannot ensure that it will always work on either your end or our end. If the connection with a video visit is poor, the visit may have to be switched to a telephone visit. With either a video or telephone visit, we are not always able to ensure that we have a secure connection.  By engaging in this virtual visit, you consent to the provision of healthcare and authorize for your insurance to be billed (if applicable) for the services provided during this visit. Depending on your insurance coverage, you may receive a charge related to this service.  I need to obtain your verbal consent now. Are you willing to proceed with your visit today? Darrell [name] has provided verbal consent on 06/15/2023 for a virtual visit (video or telephone). Darrell Huger, FNP  Date: 06/15/2023 4:55 PM   Virtual Visit via Video Note   I, Darrell [name], connected with  Darrell [name]  ([id number], 1991-01-20) on 06/15/23 at  4:45 PM EDT by a video-enabled telemedicine application and verified that I am speaking with the correct person using two identifiers.  Location: Patient: Virtual Visit Location Patient:  Home Provider: Virtual Visit Location Provider: Home Office   I discussed the limitations of evaluation and management by telemedicine and the availability of in person appointments. The patient expressed understanding and agreed to proceed.    History of Present Illness: Darrell [name] is a 33 y.o. who identifies as a male who was assigned male at birth, and is being seen today for nausea, vomiting and diarrhea starting last night after eating [facility]. Started last night. More nausea and diarrhea today. No fever. No severe abd pain. Aaron Aas  HPI: HPI  Problems:  Patient Active Problem List   Diagnosis Date Noted   Pap smear of anus with ASCUS 04/15/2023   Low grade intrepith lesion cyto smr anus (LGSIL) 04/15/2023   Vapes nicotine containing substance 04/17/2022   Anxiety and depression 04/17/2022   COVID-19 04/17/2022   Vaccine counseling 04/17/2022   Chronic right shoulder pain 03/22/2022   HTN (hypertension) 09/05/2018   AKI (acute kidney injury) (HCC) 07/10/2016   Family history of colon cancer 06/21/2016   CRI (chronic renal insufficiency), stage 1 01/11/2016   Encounter for medical examination to establish care 09/09/2014   Elevated blood pressure 05/20/2014   HIV disease (HCC) 10/08/2013   HEADACHE, TENSION 11/09/2006   RHINITIS, ALLERGIC NEC 11/09/2006    Allergies:  Allergies  Allergen Reactions   Sulfonamide Derivatives     unknown   Medications:  Current Outpatient Medications:    dolutegravir -lamiVUDine (DOVATO ) 50-300 MG tablet, Take 1 tablet by mouth daily., Disp:  30 tablet, Rfl: 11   doxycycline  (VIBRA -TABS) 100 MG tablet, Take two tablets after sex to prevent STI, Disp: 60 tablet, Rfl: 5   Multiple Vitamin (MULTI-VITAMINS) TABS, Take by mouth., Disp: , Rfl:   Observations/Objective: Patient is well-developed, well-nourished in no acute distress.  Resting comfortably  at home.  Head is normocephalic, atraumatic.  No labored breathing.  Speech is clear and  coherent with logical content.  Patient is alert and oriented at baseline.    Assessment and Plan: 1. Nausea and vomiting, unspecified vomiting type (Primary)  2. Diarrhea, unspecified type  Increase fluids, BRATT diet, no milk or dairy, UC if sx worsen.   Follow Up Instructions: I discussed the assessment and treatment plan with the patient. The patient was provided an opportunity to ask questions and all were answered. The patient agreed with the plan and demonstrated an understanding of the instructions.  A copy of instructions were sent to the patient via MyChart unless otherwise noted below.     The patient was advised to call back or seek an in-person evaluation if the symptoms worsen or if the condition fails to improve as anticipated.    Reegan Mctighe, FNP

## 2023-06-15 NOTE — Patient Instructions (Signed)

## 2023-06-22 DIAGNOSIS — F4323 Adjustment disorder with mixed anxiety and depressed mood: Secondary | ICD-10-CM | POA: Diagnosis not present

## 2023-06-29 DIAGNOSIS — F4323 Adjustment disorder with mixed anxiety and depressed mood: Secondary | ICD-10-CM | POA: Diagnosis not present

## 2023-07-11 ENCOUNTER — Encounter (HOSPITAL_BASED_OUTPATIENT_CLINIC_OR_DEPARTMENT_OTHER): Payer: Self-pay | Admitting: Emergency Medicine

## 2023-07-11 ENCOUNTER — Ambulatory Visit
Admission: RE | Admit: 2023-07-11 | Discharge: 2023-07-11 | Disposition: A | Discharge status: Other Acute Inpatient Hospital | Source: Ambulatory Visit | Attending: Unknown Physician Specialty | Admitting: Unknown Physician Specialty

## 2023-07-11 ENCOUNTER — Emergency Department (HOSPITAL_BASED_OUTPATIENT_CLINIC_OR_DEPARTMENT_OTHER)

## 2023-07-11 ENCOUNTER — Emergency Department (HOSPITAL_BASED_OUTPATIENT_CLINIC_OR_DEPARTMENT_OTHER): Admission: EM | Admit: 2023-07-11 | Discharge: 2023-07-11 | Disposition: A | Discharge status: Home / Self Care

## 2023-07-11 ENCOUNTER — Other Ambulatory Visit: Payer: Self-pay

## 2023-07-11 VITALS — BP 135/85 | HR 66 | Temp 97.9°F | Resp 14

## 2023-07-11 DIAGNOSIS — H538 Other visual disturbances: Secondary | ICD-10-CM | POA: Diagnosis not present

## 2023-07-11 DIAGNOSIS — R4189 Other symptoms and signs involving cognitive functions and awareness: Secondary | ICD-10-CM | POA: Diagnosis not present

## 2023-07-11 DIAGNOSIS — Z21 Asymptomatic human immunodeficiency virus [HIV] infection status: Secondary | ICD-10-CM | POA: Insufficient documentation

## 2023-07-11 DIAGNOSIS — R519 Headache, unspecified: Secondary | ICD-10-CM | POA: Diagnosis not present

## 2023-07-11 DIAGNOSIS — R110 Nausea: Secondary | ICD-10-CM | POA: Diagnosis not present

## 2023-07-11 LAB — CBC WITH DIFFERENTIAL/PLATELET
Abs Immature Granulocytes: 0 10*3/uL (ref 0.00–0.07)
Basophils Absolute: 0 10*3/uL (ref 0.0–0.1)
Basophils Relative: 1 %
Eosinophils Absolute: 0.1 10*3/uL (ref 0.0–0.5)
Eosinophils Relative: 2 %
HCT: 43.6 % (ref 39.0–52.0)
Hemoglobin: 15.1 g/dL (ref 13.0–17.0)
Immature Granulocytes: 0 %
Lymphocytes Relative: 54 %
Lymphs Abs: 2.2 10*3/uL (ref 0.7–4.0)
MCH: 30.9 pg (ref 26.0–34.0)
MCHC: 34.6 g/dL (ref 30.0–36.0)
MCV: 89.2 fL (ref 80.0–100.0)
Monocytes Absolute: 0.4 10*3/uL (ref 0.1–1.0)
Monocytes Relative: 10 %
Neutro Abs: 1.3 10*3/uL — ABNORMAL LOW (ref 1.7–7.7)
Neutrophils Relative %: 33 %
Platelets: 270 10*3/uL (ref 150–400)
RBC: 4.89 MIL/uL (ref 4.22–5.81)
RDW: 12.2 % (ref 11.5–15.5)
WBC: 4 10*3/uL (ref 4.0–10.5)
nRBC: 0 % (ref 0.0–0.2)

## 2023-07-11 LAB — BASIC METABOLIC PANEL WITH GFR
Anion gap: 9 (ref 5–15)
BUN: 10 mg/dL (ref 6–20)
CO2: 26 mmol/L (ref 22–32)
Calcium: 9 mg/dL (ref 8.9–10.3)
Chloride: 103 mmol/L (ref 98–111)
Creatinine, Ser: 1.34 mg/dL — ABNORMAL HIGH (ref 0.61–1.24)
GFR, Estimated: >60 mL/min (ref >60)
Glucose, Bld: 94 mg/dL (ref 70–99)
Potassium: 3.8 mmol/L (ref 3.5–5.1)
Sodium: 138 mmol/L (ref 135–145)

## 2023-07-11 MED ORDER — DEXAMETHASONE SODIUM PHOSPHATE 10 MG/ML IJ SOLN
10.0000 mg | Freq: Once | INTRAMUSCULAR | Status: AC
  Administered 2023-07-11: 10 mg via INTRAVENOUS
  Filled 2023-07-11: qty 1

## 2023-07-11 MED ORDER — DIPHENHYDRAMINE HCL 50 MG/ML IJ SOLN
50.0000 mg | Freq: Once | INTRAMUSCULAR | Status: AC
  Administered 2023-07-11: 50 mg via INTRAVENOUS
  Filled 2023-07-11: qty 1

## 2023-07-11 MED ORDER — PROCHLORPERAZINE EDISYLATE 10 MG/2ML IJ SOLN
10.0000 mg | Freq: Once | INTRAMUSCULAR | Status: AC
  Administered 2023-07-11: 10 mg via INTRAVENOUS
  Filled 2023-07-11: qty 2

## 2023-07-11 MED ORDER — IOHEXOL 350 MG/ML SOLN
100.0000 mL | Freq: Once | INTRAVENOUS | Status: AC | PRN
  Administered 2023-07-11: 75 mL via INTRAVENOUS

## 2023-07-11 NOTE — ED Provider Notes (Signed)
 [place] [facility] Provider Note   CSN: [id number] Arrival date & time: 07/11/23  7829     History  Chief Complaint  Patient presents with   Migraine    Darrell [name] is a 33 y.o. male.  33 year old male with past medical history of HIV on Hart therapy with undetectable viral load presenting to the emergency department today with headache.  The patient states he has been having a right-sided headache since Sunday.  He states that this did start relatively abruptly.  States that he initially had some blurred vision in his right eye.  This has since improved.  He states that he has had persistent headache that has been moderate in intensity.  He went to urgent care today and was subsequently sent to the ER for further evaluation.  The patient denies any fevers or chills.  He does report a family history of migraine headaches but has never had any issues himself.  He denies any focal weakness, numbness, or tingling.  He came to the ER today for further evaluation regarding this after being sent in by urgent care.   Migraine Associated symptoms include headaches.       Home Medications Prior to Admission medications   Medication Sig Start Date End Date Taking? Authorizing Provider  dolutegravir -lamiVUDine (DOVATO ) 50-300 MG tablet Take 1 tablet by mouth daily. 05/14/23   Charolette Copier, MD  doxycycline  (VIBRA -TABS) 100 MG tablet Take two tablets after sex to prevent STI 05/14/23   Ernie Heal, Jerelyn Money, MD  Multiple Vitamin (MULTI-VITAMINS) TABS Take by mouth.    [provider]      Allergies    Sulfonamide derivatives    Review of Systems   Review of Systems  Neurological:  Positive for headaches.  All other systems reviewed and are negative.   Physical Exam Updated Vital Signs BP 107/78   Pulse (!) 56   Temp 97.8 F (36.6 C)   Resp 18   Wt 85.3 kg   SpO2 100%   BMI 26.22 kg/m  Physical Exam Vitals and nursing  note reviewed.   Gen: NAD Eyes: PERRL, EOMI HEENT: no oropharyngeal swelling Neck: trachea midline, no meningismus Resp: clear to auscultation bilaterally Card: RRR, no murmurs, rubs, or gallops Abd: nontender, nondistended Extremities: no calf tenderness, no edema Vascular: 2+ radial pulses bilaterally, 2+ DP pulses bilaterally Neuro: Alert and oriented x 3, no dysarthria or aphasia noted, cranial nerves intact, equal strength and sensation throughout bilateral upper and lower extremities with no dysmetria on finger-to-nose testing Skin: no rashes Psyc: acting appropriately   ED Results / Procedures / Treatments   Labs (all labs ordered are listed, but only abnormal results are displayed) Labs Reviewed  CBC WITH DIFFERENTIAL/PLATELET - Abnormal; Notable for the following components:      Result Value   Neutro Abs 1.3 (*)    All other components within normal limits  BASIC METABOLIC PANEL WITH GFR - Abnormal; Notable for the following components:   Creatinine, Ser 1.34 (*)    All other components within normal limits    EKG None  Radiology CT ANGIO HEAD NECK W WO CM Result Date: 07/11/2023 CLINICAL DATA:  Sudden onset severe right-sided headache for 4 days EXAM: CT ANGIOGRAPHY HEAD AND NECK WITH AND WITHOUT CONTRAST TECHNIQUE: Multidetector CT imaging of the head and neck was performed using the standard protocol during bolus administration of intravenous contrast. Multiplanar CT image reconstructions and MIPs were obtained  to evaluate the vascular anatomy. Carotid stenosis measurements (when applicable) are obtained utilizing NASCET criteria, using the distal internal carotid diameter as the denominator. RADIATION DOSE REDUCTION: This exam was performed according to the departmental dose-optimization program which includes automated exposure control, adjustment of the mA and/or kV according to patient size and/or use of iterative reconstruction technique. CONTRAST:  75mL OMNIPAQUE  IOHEXOL 350 MG/ML SOLN COMPARISON:  Head CT 07/18/2009 FINDINGS: CT HEAD FINDINGS Brain: No mass,hemorrhage or extra-axial collection. Normal appearance of the parenchyma and CSF spaces. Vascular: No hyperdense vessel or unexpected vascular calcification. Skull: The visualized skull base, calvarium and extracranial soft tissues are normal. Sinuses/Orbits: No fluid levels or advanced mucosal thickening of the visualized paranasal sinuses. No mastoid or middle ear effusion. Normal orbits. CTA NECK FINDINGS Skeleton: No acute abnormality or high grade bony spinal canal stenosis. Other neck: Normal pharynx, larynx and major salivary glands. No cervical lymphadenopathy. Unremarkable thyroid gland. Upper chest: No pneumothorax or pleural effusion. No nodules or masses. Aortic arch: There is no calcific atherosclerosis of the aortic arch. Conventional 3 vessel aortic branching pattern. RIGHT carotid system: Normal without aneurysm, dissection or stenosis. LEFT carotid system: Normal without aneurysm, dissection or stenosis. Vertebral arteries: Codominant configuration. There is no dissection, occlusion or flow-limiting stenosis to the skull base (V1-V3 segments). CTA HEAD FINDINGS POSTERIOR CIRCULATION: Vertebral arteries are normal. No proximal occlusion of the anterior or inferior cerebellar arteries. Basilar artery is normal. Superior cerebellar arteries are normal. Posterior cerebral arteries are normal. ANTERIOR CIRCULATION: Intracranial internal carotid arteries are normal. Anterior cerebral arteries are normal. Middle cerebral arteries are normal. Venous sinuses: As permitted by contrast timing, patent. Anatomic variants: None Review of the MIP images confirms the above findings. IMPRESSION: Normal CTA of the head and neck. Electronically Signed   By: Juanetta Nordmann M.D.   On: 07/11/2023 12:59    Procedures Procedures    Medications Ordered in ED Medications  dexamethasone (DECADRON) injection 10 mg (has no  administration in time range)  prochlorperazine (COMPAZINE) injection 10 mg (10 mg Intravenous Given 07/11/23 1015)  diphenhydrAMINE (BENADRYL) injection 50 mg (50 mg Intravenous Given 07/11/23 1017)  iohexol (OMNIPAQUE) 350 MG/ML injection 100 mL (75 mLs Intravenous Contrast Given 07/11/23 1039)    ED Course/ Medical Decision Making/ A&P                                 Medical Decision Making 33 year old male with past medical history of HIV on Ami Kail therapy presents the emergency department today right-sided headache.  Neurologic Sam is reassuring.  I will further evaluate the patient here with basic labs as well as a CT scan of his head and CT angiogram to evaluate for subarachnoid hemorrhage, intracranial hemorrhage, or mass lesion.  Time due to migraine headache as well.  Will give him Compazine and Benadryl here to see if this helps with symptoms.  I will reevaluate for ultimate disposition.  The patient's labs here are reassuring.  His CT scan and CT angiogram are negative.  On reassessment after the Compazine and Benadryl his headache has resolved.  Lumbar puncture was discussed with the patient with negative CT head and CT angiogram and resolution of headache with medications here is ultimately not pursued through shared decision-making with the patient.  He is given Decadron.  Is encouraged to follow-up with his primary care provider for reevaluation.  He is encouraged to return to the emergency department for  worsening symptoms or fevers.  Amount and/or Complexity of Data Reviewed Labs: ordered. Radiology: ordered.  Risk Prescription drug management.           Final Clinical Impression(s) / ED Diagnoses Final diagnoses:  Nonintractable headache, unspecified chronicity pattern, unspecified headache type    Rx / DC Orders ED Discharge Orders     None         Carin Charleston, MD 07/11/23 1315

## 2023-07-11 NOTE — ED Provider Notes (Signed)
 [facility]    CSN: [id number] Arrival date & time: 07/11/23  1027      History   Chief Complaint Chief Complaint  Patient presents with   Headache    Right eye blurry Shape pain right side of head Foggy Low energy - Entered by patient    HPI Darrell [name] is a 33 y.o. male presents for evaluation of headache.  Patient has a past medical history of hypertension, HIV.  Patient reports 4 days ago he had a sudden onset of a right-sided headache that has persisted since that time.  He does describe it as a thunderclap type headache that is a sharp pain behind his eye at the base of his neck.  States at the time he had blurry vision to that right eye but that is since resolved and not returned.  Does state when it initially started it was the worst of his life and currently rates his at a 6 out of 10 today and denies worsening of life at this time.  He does have dizziness, brain fog/difficulty finding words, difficulty concentrating.  Describes it as a brain freeze.  He denies any dizziness, vomiting, syncope.  He denies any history of headaches such as tension or migraines.  He does have a strong family history of migraines as well as brain tumor.  No first-degree relative with history of SAH.  He has tried Tylenol  ibuprofen  without any change in his symptoms.   Headache Associated symptoms: nausea     Past Medical History:  Diagnosis Date   AKI (acute kidney injury) (HCC) 07/10/2016   Anxiety and depression 04/17/2022   COVID-19 04/17/2022   HIV infection (HCC)    HTN (hypertension) 09/05/2018   Low grade intrepith lesion cyto smr anus (LGSIL) 04/15/2023   Pap smear of anus with ASCUS 04/15/2023   Screen for STD (sexually transmitted disease) 09/09/2014   Smoker 11/23/2014   Vaccine counseling 04/17/2022   Vapes nicotine containing substance 04/17/2022    Patient Active Problem List   Diagnosis Date Noted   Pap smear of anus with ASCUS 04/15/2023   Low grade  intrepith lesion cyto smr anus (LGSIL) 04/15/2023   Vapes nicotine containing substance 04/17/2022   Anxiety and depression 04/17/2022   COVID-19 04/17/2022   Vaccine counseling 04/17/2022   Chronic right shoulder pain 03/22/2022   HTN (hypertension) 09/05/2018   AKI (acute kidney injury) (HCC) 07/10/2016   Family history of colon cancer 06/21/2016   CRI (chronic renal insufficiency), stage 1 01/11/2016   Encounter for medical examination to establish care 09/09/2014   Elevated blood pressure 05/20/2014   HIV disease (HCC) 10/08/2013   HEADACHE, TENSION 11/09/2006   RHINITIS, ALLERGIC NEC 11/09/2006    Past Surgical History:  Procedure Laterality Date   HIGH RESOLUTION ANOSCOPY N/A 01/17/2023   Procedure: HIGH RESOLUTION ANOSCOPY;  Surgeon: Joyce Nixon, MD;  Location: [facility];  Service: General;  Laterality: N/A;   RECTAL BIOPSY N/A 01/17/2023   Procedure: BIOPSY RECTAL AND ABLATION;  Surgeon: Joyce Nixon, MD;  Location: [facility];  Service: General;  Laterality: N/A;       Home Medications    Prior to Admission medications   Medication Sig Start Date End Date Taking? Authorizing Provider  dolutegravir -lamiVUDine (DOVATO ) 50-300 MG tablet Take 1 tablet by mouth daily. 05/14/23   Charolette Copier, MD  doxycycline  (VIBRA -TABS) 100 MG tablet Take two tablets after sex to prevent STI 05/14/23   Carloyn Chi  Dam, Jerelyn Money, MD  Multiple Vitamin (MULTI-VITAMINS) TABS Take by mouth.    [provider]    Family History Family History  Problem Relation Age of Onset   Cancer Mother    Fibromyalgia Mother    Hypertension Mother     Social History Social History   Tobacco Use   Smoking status: Light Smoker    Types: E-cigarettes   Smokeless tobacco: Never   Tobacco comments:    Daily  Vaping Use   Vaping status: Some Days  Substance Use Topics   Alcohol use: Yes    Comment: socially   Drug use: Yes    Frequency: 7.0 times  per week    Types: Marijuana    Comment: socially     Allergies   Sulfonamide derivatives   Review of Systems Review of Systems  Gastrointestinal:  Positive for nausea.     Physical Exam Triage Vital Signs ED Triage Vitals  Encounter Vitals Group     BP 07/11/23 0842 135/85     Systolic BP Percentile --      Diastolic BP Percentile --      Pulse Rate 07/11/23 0842 66     Resp 07/11/23 0842 14     Temp 07/11/23 0842 97.9 F (36.6 C)     Temp Source 07/11/23 0842 Oral     SpO2 07/11/23 0842 97 %     Weight --      Height --      Head Circumference --      Peak Flow --      Pain Score 07/11/23 0838 6     Pain Loc --      Pain Education --      Exclude from Growth Chart --    No data found.  Updated Vital Signs BP 135/85   Pulse 66   Temp 97.9 F (36.6 C) (Oral)   Resp 14   SpO2 97%   Visual Acuity Right Eye Distance:   Left Eye Distance:   Bilateral Distance:    Right Eye Near:   Left Eye Near:    Bilateral Near:     Physical Exam Vitals and nursing note reviewed.  Constitutional:      General: He is not in acute distress.    Appearance: Normal appearance. He is not ill-appearing.  HENT:     Head: Normocephalic and atraumatic.  Eyes:     Extraocular Movements: Extraocular movements intact.     Conjunctiva/sclera: Conjunctivae normal.     Pupils: Pupils are equal, round, and reactive to light.  Cardiovascular:     Rate and Rhythm: Normal rate.  Pulmonary:     Effort: Pulmonary effort is normal.  Skin:    General: Skin is warm and dry.  Neurological:     General: No focal deficit present.     Mental Status: He is alert and oriented to person, place, and time.     GCS: GCS eye subscore is 4. GCS verbal subscore is 5. GCS motor subscore is 6.     Cranial Nerves: No facial asymmetry.     Motor: No weakness or pronator drift.     Coordination: Romberg sign negative. Finger-Nose-Finger Test normal.     Gait: Tandem walk normal.  Psychiatric:         Mood and Affect: Mood normal.        Behavior: Behavior normal.      UC Treatments / Results  Labs (all labs ordered  are listed, but only abnormal results are displayed) Labs Reviewed - No data to display  EKG   Radiology No results found.  Procedures Procedures (including critical care time)  Medications Ordered in UC Medications - No data to display  Initial Impression / Assessment and Plan / UC Course  I have reviewed the triage vital signs and the nursing notes.  Pertinent labs & imaging results that were available during my care of the patient were reviewed by me and considered in my medical decision making (see chart for details).     I reviewed exam and symptoms with patient.  Discussed limitations and abilities of urgent care.  Patient presenting with new onset headache x 4 days.  No history of migraines.  Describes initial onset of thunderclap/worst headache of life.  Also reports strong family history of migraines and brain tumor.  Given his presentation and family history advised to go to the emergency room for further workup and possible imaging.  He is in agreement plan will go POV with his mother to the emergency room. Final Clinical Impressions(s) / UC Diagnoses   Final diagnoses:  Acute intractable headache, unspecified headache type  Nausea  Brain fog     Discharge Instructions      Please go to the ER for further evaluation of your symptoms  ED Prescriptions   None    PDMP not reviewed this encounter.   Alleen Arbour, NP 07/11/23 [phone]

## 2023-07-11 NOTE — ED Notes (Signed)
 Patient is being discharged from the Urgent Care and sent to the Emergency Department via POV . Per Ramonita Burow, NP, patient is in need of higher level of care due to needing higher level of care. Patient is aware and verbalizes understanding of plan of care.  Vitals:   07/11/23 0842  BP: 135/85  Pulse: 66  Resp: 14  Temp: 97.9 F (36.6 C)  SpO2: 97%

## 2023-07-11 NOTE — Discharge Instructions (Signed)
 Please go to the ER for further evaluation of your symptoms

## 2023-07-11 NOTE — ED Triage Notes (Signed)
 Headache x 3 days , worse on right side , no Hx migraine . Reports feeling " foggy" . Sensitive to noises . Denies congestion .  Alert and oriented x 4 , ambulatory with steady gait .

## 2023-07-11 NOTE — ED Notes (Signed)
 Pt did have nystagmus when I checked his eyes during [facility]

## 2023-07-11 NOTE — ED Triage Notes (Signed)
 Pt c/o pain on the entire right side of head and right eye blurry. Pt states when this started on Sunday it felt like a "brain freeze."

## 2023-07-13 ENCOUNTER — Telehealth

## 2023-07-13 DIAGNOSIS — F4323 Adjustment disorder with mixed anxiety and depressed mood: Secondary | ICD-10-CM | POA: Diagnosis not present

## 2023-07-20 DIAGNOSIS — F4323 Adjustment disorder with mixed anxiety and depressed mood: Secondary | ICD-10-CM | POA: Diagnosis not present

## 2023-07-27 DIAGNOSIS — F4323 Adjustment disorder with mixed anxiety and depressed mood: Secondary | ICD-10-CM | POA: Diagnosis not present

## 2023-08-16 DIAGNOSIS — A5400 Gonococcal infection of lower genitourinary tract, unspecified: Secondary | ICD-10-CM | POA: Diagnosis not present

## 2023-08-16 DIAGNOSIS — Z113 Encounter for screening for infections with a predominantly sexual mode of transmission: Secondary | ICD-10-CM | POA: Diagnosis not present

## 2023-10-06 ENCOUNTER — Telehealth: Admitting: Physician Assistant

## 2023-10-06 DIAGNOSIS — J101 Influenza due to other identified influenza virus with other respiratory manifestations: Secondary | ICD-10-CM | POA: Diagnosis not present

## 2023-10-06 DIAGNOSIS — R6889 Other general symptoms and signs: Secondary | ICD-10-CM

## 2023-10-06 MED ORDER — OSELTAMIVIR PHOSPHATE 75 MG PO CAPS: 75.0000 mg | ORAL_CAPSULE | Freq: Two times a day (BID) | ORAL | 0 refills | Status: AC

## 2023-10-06 NOTE — Patient Instructions (Signed)
 Darrell [name], thank you for joining Darrell Velma Lunger, PA-C for today's virtual visit.  While this provider is not your primary care provider (PCP), if your PCP is located in our provider database this encounter information will be shared with them immediately following your visit.   A [facility] MyChart account gives you access to today's visit and all your visits, tests, and labs performed at [facility]  click here if you don't have a [facility] MyChart account or go to [email or web address]  Consent: (Patient) Darrell [name] provided verbal consent for this virtual visit at the beginning of the encounter.  Current Medications:  Current Outpatient Medications:    oseltamivir  (TAMIFLU ) 75 MG capsule, Take 1 capsule (75 mg total) by mouth 2 (two) times daily for 5 days., Disp: 10 capsule, Rfl: 0   dolutegravir -lamiVUDine (DOVATO ) 50-300 MG tablet, Take 1 tablet by mouth daily., Disp: 30 tablet, Rfl: 11   doxycycline  (VIBRA -TABS) 100 MG tablet, Take two tablets after sex to prevent STI, Disp: 60 tablet, Rfl: 5   Multiple Vitamin (MULTI-VITAMINS) TABS, Take by mouth., Disp: , Rfl:    Medications ordered in this encounter:  Meds ordered this encounter  Medications   oseltamivir  (TAMIFLU ) 75 MG capsule    Sig: Take 1 capsule (75 mg total) by mouth 2 (two) times daily for 5 days.    Dispense:  10 capsule    Refill:  0    Supervising Provider:   BLAISE ALEENE KIDD [[id number]]     *If you need refills on other medications prior to your next appointment, please contact your pharmacy*  Follow-Up: Call back or seek an in-person evaluation if the symptoms worsen or if the condition fails to improve as anticipated.  [facility] [phone]  Other Instructions Please keep well-hydrated and try to get plenty of rest. If you have a humidifier, place it in the bedroom and run it at night. Start a saline nasal rinse for nasal congestion. You can consider  use of a nasal steroid spray like Flonase  or Nasacort OTC. You can alternate between Tylenol  and Ibuprofen  if needed for fever, body aches, headache and/or throat pain. Salt water-gargles and chloraseptic spray can be very beneficial for sore throat. Mucinex-DM for congestion or cough. Please take all prescribed medications as directed.  Remain out of work until [employer] for 24 hours without a fever-reducing medication, and you are feeling better.  You should mask until symptoms are resolved.  If anything worsens despite treatment, you need to be evaluated in-person. Please do not delay care.  Influenza, Adult Influenza is also called the flu. It is an infection in the lungs, nose, and throat (respiratory tract). It spreads easily from person to person (is contagious). The flu causes symptoms that are like a cold, along with high fever and body aches. What are the causes? This condition is caused by the influenza virus. You can get the virus by: Breathing in droplets that are in the air after a person infected with the flu coughed or sneezed. Touching something that has the virus on it and then touching your mouth, nose, or eyes. What increases the risk? Certain things may make you more likely to get the flu. These include: Not washing your hands often. Having close contact with many people during cold and flu season. Touching your mouth, eyes, or nose without first washing your hands. Not getting a flu shot every year. You may have a higher risk for the flu, and  serious problems, such as a lung infection (pneumonia), if you: Are older than 65. Are pregnant. Have a weakened disease-fighting system (immune system) because of a disease or because you are taking certain medicines. Have a long-term (chronic) condition, such as: Heart, kidney, or lung disease. Diabetes. Asthma. Have a liver disorder. Are very overweight (morbidly obese). Have anemia. What are the signs or  symptoms? Symptoms usually begin suddenly and last 4-14 days. They may include: Fever and chills. Headaches, body aches, or muscle aches. Sore throat. Cough. Runny or stuffy (congested) nose. Feeling discomfort in your chest. Not wanting to eat as much as normal. Feeling weak or tired. Feeling dizzy. Feeling sick to your stomach or throwing up. How is this treated? If the flu is found early, you can be treated with antiviral medicine. This can help to reduce how bad the illness is and how long it lasts. This may be given by mouth or through an IV tube. Taking care of yourself at home can help your symptoms get better. Your doctor may want you to: Take over-the-counter medicines. Drink plenty of fluids. The flu often goes away on its own. If you have very bad symptoms or other problems, you may be treated in a hospital. Follow these instructions at home:     Activity Rest as needed. Get plenty of sleep. Stay home from work or school as told by your doctor. Do not leave home until you do not have a fever for 24 hours without taking medicine. Leave home only to go to your doctor. Eating and drinking Take an ORS (oral rehydration solution). This is a drink that is sold at pharmacies and stores. Drink enough fluid to keep your pee pale yellow. Drink clear fluids in small amounts as you are able. Clear fluids include: Water. Ice chips. Fruit juice mixed with water. Low-calorie sports drinks. Eat bland foods that are easy to digest. Eat small amounts as you are able. These foods include: Bananas. Applesauce. Rice. Lean meats. Toast. Crackers. Do not eat or drink: Fluids that have a lot of sugar or caffeine. Alcohol. Spicy or fatty foods. General instructions Take over-the-counter and prescription medicines only as told by your doctor. Use a cool mist humidifier to add moisture to the air in your home. This can make it easier for you to breathe. When using a cool mist  humidifier, clean it daily. Empty water and replace with clean water. Cover your mouth and nose when you cough or sneeze. Wash your hands with soap and water often and for at least 20 seconds. This is also important after you cough or sneeze. If you cannot use soap and water, use alcohol-based hand sanitizer. Keep all follow-up visits. How is this prevented?  Get a flu shot every year. You may get the flu shot in late summer, fall, or winter. Ask your doctor when you should get your flu shot. Avoid contact with people who are sick during fall and winter. This is cold and flu season. Contact a doctor if: You get new symptoms. You have: Chest pain. Watery poop (diarrhea). A fever. Your cough gets worse. You start to have more mucus. You feel sick to your stomach. You throw up. Get help right away if you: Have shortness of breath. Have trouble breathing. Have skin or nails that turn a bluish color. Have very bad pain or stiffness in your neck. Get a sudden headache. Get sudden pain in your face or ear. Cannot eat or drink without throwing up.  These symptoms may represent a serious problem that is an emergency. Get medical help right away. Call your local emergency services (911 in the [place] Do not wait to see if the symptoms will go away. Do not drive yourself to the hospital. Summary Influenza is also called the flu. It is an infection in the lungs, nose, and throat. It spreads easily from person to person. Take over-the-counter and prescription medicines only as told by your doctor. Getting a flu shot every year is the best way to not get the flu. This information is not intended to replace advice given to you by your health care provider. Make sure you discuss any questions you have with your health care provider. Document Revised: 09/26/2019 Document Reviewed: 09/26/2019 Elsevier Patient Education  2023 Elsevier Inc.      If you have been instructed to have an in-person  evaluation today at a local Urgent Care facility, please use the link below. It will take you to a list of all of our available [facility], including address, phone number and hours of operation. Please do not delay care.  [facility]  If you or a family member do not have a primary care provider, use the link below to schedule a visit and establish care. When you choose a [facility] primary care physician or advanced practice provider, you gain a long-term partner in health. Find a Primary Care Provider  Learn more about [facility]'s in-office and virtual care options: [facility] - Get Care Now

## 2023-10-06 NOTE — Progress Notes (Signed)
 Virtual Visit Consent   Darrell [name], you are scheduled for a virtual visit with a [facility] provider today. Just as with appointments in the office, your consent must be obtained to participate. Your consent will be active for this visit and any virtual visit you may have with one of our providers in the next 365 days. If you have a MyChart account, a copy of this consent can be sent to you electronically.  As this is a virtual visit, video technology does not allow for your provider to perform a traditional examination. This may limit your provider's ability to fully assess your condition. If your provider identifies any concerns that need to be evaluated in person or the need to arrange testing (such as labs, EKG, etc.), we will make arrangements to do so. Although advances in technology are sophisticated, we cannot ensure that it will always work on either your end or our end. If the connection with a video visit is poor, the visit may have to be switched to a telephone visit. With either a video or telephone visit, we are not always able to ensure that we have a secure connection.  By engaging in this virtual visit, you consent to the provision of healthcare and authorize for your insurance to be billed (if applicable) for the services provided during this visit. Depending on your insurance coverage, you may receive a charge related to this service.  I need to obtain your verbal consent now. Are you willing to proceed with your visit today? Darrell [name] has provided verbal consent on 10/06/2023 for a virtual visit (video or telephone). Darrell [name], [place]  Date: 10/06/2023 2:16 PM   Virtual Visit via Video Note   I, Darrell [name], connected with  Darrell [name]  ([id number], 29-Jul-1990) on 10/06/23 at  2:00 PM EDT by a video-enabled telemedicine application and verified that I am speaking with the correct person using two identifiers.  Location: Patient: Virtual Visit  Location Patient: Home Provider: Virtual Visit Location Provider: Home Office   I discussed the limitations of evaluation and management by telemedicine and the availability of in person appointments. The patient expressed understanding and agreed to proceed.    History of Present Illness: Darrell [name] is a 33 y.o. who identifies as a male who was assigned male at birth, and is being seen today for abrupt onset of headache, rhinorrhea, congestion and body aches last night. Start use of Mucinex and OTc analgesics. Notes chills. Denies chest pain or SOB. Did take a home COVID test that was positive but just notes he was diagnosed and treated for COVID within the past month. Has not taken a home flu test. .   HPI: HPI  Problems:  Patient Active Problem List   Diagnosis Date Noted   Pap smear of anus with ASCUS 04/15/2023   Low grade intrepith lesion cyto smr anus (LGSIL) 04/15/2023   Vapes nicotine containing substance 04/17/2022   Anxiety and depression 04/17/2022   COVID-19 04/17/2022   Vaccine counseling 04/17/2022   Chronic right shoulder pain 03/22/2022   HTN (hypertension) 09/05/2018   AKI (acute kidney injury) (HCC) 07/10/2016   Family history of colon cancer 06/21/2016   CRI (chronic renal insufficiency), stage 1 01/11/2016   Encounter for medical examination to establish care 09/09/2014   Elevated blood pressure 05/20/2014   HIV disease (HCC) 10/08/2013   HEADACHE, TENSION 11/09/2006   RHINITIS, ALLERGIC NEC 11/09/2006    Allergies:  Allergies  Allergen Reactions   Sulfonamide Derivatives     unknown   Medications:  Current Outpatient Medications:    oseltamivir  (TAMIFLU ) 75 MG capsule, Take 1 capsule (75 mg total) by mouth 2 (two) times daily for 5 days., Disp: 10 capsule, Rfl: 0   dolutegravir -lamiVUDine (DOVATO ) 50-300 MG tablet, Take 1 tablet by mouth daily., Disp: 30 tablet, Rfl: 11   doxycycline  (VIBRA -TABS) 100 MG tablet, Take two tablets after sex to prevent  STI, Disp: 60 tablet, Rfl: 5   Multiple Vitamin (MULTI-VITAMINS) TABS, Take by mouth., Disp: , Rfl:   Observations/Objective: Patient is well-developed, well-nourished in no acute distress.  Resting comfortably  at home.  Head is normocephalic, atraumatic.  No labored breathing.  Speech is clear and coherent with logical content.  Patient is alert and oriented at baseline.    Assessment and Plan: 1. Flu-like symptoms (Primary) - oseltamivir  (TAMIFLU ) 75 MG capsule; Take 1 capsule (75 mg total) by mouth 2 (two) times daily for 5 days.  Dispense: 10 capsule; Refill: 0  Concern for influenza with abrupt onset of classic symptoms. Should not be COVID positive as he was just diagnosed and treated for this, this past month with confirmatory testing. Suspected false-positive home testing. Supportive measures, OTC medications and Vitamin recommendations reviewed. Will start Tamiflu  per orders. Quarantine reviewed with patient.    Follow Up Instructions: I discussed the assessment and treatment plan with the patient. The patient was provided an opportunity to ask questions and all were answered. The patient agreed with the plan and demonstrated an understanding of the instructions.  A copy of instructions were sent to the patient via MyChart unless otherwise noted below.   The patient was advised to call back or seek an in-person evaluation if the symptoms worsen or if the condition fails to improve as anticipated.    Darrell Velma Lunger, PA-C

## 2023-11-13 NOTE — Progress Notes (Unsigned)
   Subjective:  Chief complaint: follow-up for HIV disease on medications   Patient ID: Darrell [name], male    DOB: June 19, 1990, 33 y.o.   MRN: [id number]  HPI  Past Medical History:  Diagnosis Date   AKI (acute kidney injury) 07/10/2016   Anxiety and depression 04/17/2022   COVID-19 04/17/2022   HIV infection (HCC)    HTN (hypertension) 09/05/2018   Low grade intrepith lesion cyto smr anus (LGSIL) 04/15/2023   Pap smear of anus with ASCUS 04/15/2023   Screen for STD (sexually transmitted disease) 09/09/2014   Smoker 11/23/2014   Vaccine counseling 04/17/2022   Vapes nicotine containing substance 04/17/2022    Past Surgical History:  Procedure Laterality Date   HIGH RESOLUTION ANOSCOPY N/A 01/17/2023   Procedure: HIGH RESOLUTION ANOSCOPY;  Surgeon: Debby Hila, MD;  Location: [facility];  Service: General;  Laterality: N/A;   RECTAL BIOPSY N/A 01/17/2023   Procedure: BIOPSY RECTAL AND ABLATION;  Surgeon: Debby Hila, MD;  Location: [facility];  Service: General;  Laterality: N/A;    Family History  Problem Relation Age of Onset   Cancer Mother    Fibromyalgia Mother    Hypertension Mother       Social History   Socioeconomic History   Marital status: Single    Spouse name: Not on file   Number of children: Not on file   Years of education: Not on file   Highest education level: Not on file  Occupational History   Not on file  Tobacco Use   Smoking status: Light Smoker    Types: E-cigarettes   Smokeless tobacco: Never   Tobacco comments:    Daily  Vaping Use   Vaping status: Some Days  Substance and Sexual Activity   Alcohol use: Yes    Comment: socially   Drug use: Yes    Frequency: 7.0 times per week    Types: Marijuana    Comment: socially   Sexual activity: Yes    Partners: Male    Birth control/protection: Condom    Comment: declined condoms  Other Topics Concern   Not on file  Social History Narrative    Not on file   Social Drivers of Health   Financial Resource Strain: Not on file  Food Insecurity: Not on file  Transportation Needs: Not on file  Physical Activity: Not on file  Stress: Not on file  Social Connections: Not on file    Allergies  Allergen Reactions   Sulfonamide Derivatives     unknown     Current Outpatient Medications:    dolutegravir -lamiVUDine (DOVATO ) 50-300 MG tablet, Take 1 tablet by mouth daily., Disp: 30 tablet, Rfl: 11   doxycycline  (VIBRA -TABS) 100 MG tablet, Take two tablets after sex to prevent STI, Disp: 60 tablet, Rfl: 5   Multiple Vitamin (MULTI-VITAMINS) TABS, Take by mouth., Disp: , Rfl:    Review of Systems     Objective:   Physical Exam        Assessment & Plan:

## 2023-11-14 ENCOUNTER — Other Ambulatory Visit: Payer: Self-pay

## 2023-11-14 ENCOUNTER — Other Ambulatory Visit (HOSPITAL_COMMUNITY)
Admission: RE | Admit: 2023-11-14 | Discharge: 2023-11-14 | Disposition: A | Discharge status: Home / Self Care | Source: Ambulatory Visit | Attending: Infectious Disease | Admitting: Infectious Disease

## 2023-11-14 ENCOUNTER — Telehealth

## 2023-11-14 ENCOUNTER — Encounter: Payer: Self-pay | Admitting: Infectious Disease

## 2023-11-14 ENCOUNTER — Ambulatory Visit (INDEPENDENT_AMBULATORY_CARE_PROVIDER_SITE_OTHER): Admitting: Infectious Disease

## 2023-11-14 VITALS — BP 112/76 | HR 60 | Ht 71.0 in | Wt 187.0 lb

## 2023-11-14 DIAGNOSIS — Z7185 Encounter for immunization safety counseling: Secondary | ICD-10-CM

## 2023-11-14 DIAGNOSIS — B20 Human immunodeficiency virus [HIV] disease: Secondary | ICD-10-CM

## 2023-11-14 DIAGNOSIS — Z800 Family history of malignant neoplasm of digestive organs: Secondary | ICD-10-CM | POA: Diagnosis not present

## 2023-11-14 DIAGNOSIS — R85610 Atypical squamous cells of undetermined significance on cytologic smear of anus (ASC-US): Secondary | ICD-10-CM

## 2023-11-14 DIAGNOSIS — Z113 Encounter for screening for infections with a predominantly sexual mode of transmission: Secondary | ICD-10-CM | POA: Insufficient documentation

## 2023-11-14 DIAGNOSIS — R85612 Low grade squamous intraepithelial lesion on cytologic smear of anus (LGSIL): Secondary | ICD-10-CM

## 2023-11-14 DIAGNOSIS — F321 Major depressive disorder, single episode, moderate: Secondary | ICD-10-CM

## 2023-11-14 DIAGNOSIS — I10 Essential (primary) hypertension: Secondary | ICD-10-CM

## 2023-11-14 DIAGNOSIS — R5383 Other fatigue: Secondary | ICD-10-CM

## 2023-11-14 MED ORDER — AMOXICILLIN-POT CLAVULANATE 875-125 MG PO TABS: 1.0000 | ORAL_TABLET | Freq: Two times a day (BID) | ORAL | 0 refills | Status: AC

## 2023-11-14 MED ORDER — DOVATO 50-300 MG PO TABS: 1.0000 | ORAL_TABLET | Freq: Every day | ORAL | 11 refills | Status: AC

## 2023-11-15 LAB — T-HELPER CELLS (CD4) COUNT (NOT AT ARMC)
CD4 % Helper T Cell: 39 % (ref 33–65)
CD4 T Cell Abs: 611 /uL (ref 400–1790)

## 2023-11-16 LAB — CYTOLOGY, (ORAL, ANAL, URETHRAL) ANCILLARY ONLY
Chlamydia: NEGATIVE
Chlamydia: NEGATIVE
Comment: NEGATIVE
Comment: NEGATIVE
Comment: NORMAL
Comment: NORMAL
Neisseria Gonorrhea: NEGATIVE
Neisseria Gonorrhea: NEGATIVE

## 2023-11-16 LAB — URINE CYTOLOGY ANCILLARY ONLY
Chlamydia: NEGATIVE
Comment: NEGATIVE
Comment: NORMAL
Neisseria Gonorrhea: NEGATIVE

## 2023-11-17 LAB — CBC WITH DIFFERENTIAL/PLATELET
Absolute Lymphocytes: 1469 {cells}/uL (ref 850–3900)
Absolute Monocytes: 724 {cells}/uL (ref 200–950)
Basophils Absolute: 41 {cells}/uL (ref 0–200)
Basophils Relative: 0.8 %
Eosinophils Absolute: 117 {cells}/uL (ref 15–500)
Eosinophils Relative: 2.3 %
HCT: 44.1 % (ref 38.5–50.0)
Hemoglobin: 15 g/dL (ref 13.2–17.1)
MCH: 31.3 pg (ref 27.0–33.0)
MCHC: 34 g/dL (ref 32.0–36.0)
MCV: 91.9 fL (ref 80.0–100.0)
MPV: 8.9 fL (ref 7.5–12.5)
Monocytes Relative: 14.2 %
Neutro Abs: 2749 {cells}/uL (ref 1500–7800)
Neutrophils Relative %: 53.9 %
Platelets: 275 Thousand/uL (ref 140–400)
RBC: 4.8 Million/uL (ref 4.20–5.80)
RDW: 13 % (ref 11.0–15.0)
Total Lymphocyte: 28.8 %
WBC: 5.1 Thousand/uL (ref 3.8–10.8)

## 2023-11-17 LAB — COMPLETE METABOLIC PANEL WITHOUT GFR
AG Ratio: 1.7 (calc) (ref 1.0–2.5)
ALT: 14 U/L (ref 9–46)
AST: 18 U/L (ref 10–40)
Albumin: 4.3 g/dL (ref 3.6–5.1)
Alkaline phosphatase (APISO): 49 U/L (ref 36–130)
BUN: 12 mg/dL (ref 7–25)
CO2: 26 mmol/L (ref 20–32)
Calcium: 9 mg/dL (ref 8.6–10.3)
Chloride: 106 mmol/L (ref 98–110)
Creat: 1.15 mg/dL (ref 0.60–1.26)
Globulin: 2.5 g/dL (ref 1.9–3.7)
Glucose, Bld: 87 mg/dL (ref 65–99)
Potassium: 4.4 mmol/L (ref 3.5–5.3)
Sodium: 138 mmol/L (ref 135–146)
Total Bilirubin: 1 mg/dL (ref 0.2–1.2)
Total Protein: 6.8 g/dL (ref 6.1–8.1)

## 2023-11-17 LAB — LIPID PANEL
Cholesterol: 139 mg/dL (ref <200)
HDL: 43 mg/dL (ref >=40)
LDL Cholesterol (Calc): 80 mg/dL
Non-HDL Cholesterol (Calc): 96 mg/dL (ref <130)
Total CHOL/HDL Ratio: 3.2 (calc) (ref <5.0)
Triglycerides: 84 mg/dL (ref <150)

## 2023-11-17 LAB — RPR: RPR Ser Ql: NONREACTIVE

## 2023-11-17 LAB — HIV-1 RNA QUANT-NO REFLEX-BLD
HIV 1 RNA Quant: NOT DETECTED {copies}/mL
HIV-1 RNA Quant, Log: NOT DETECTED {Log_copies}/mL

## 2023-12-18 ENCOUNTER — Encounter: Payer: Self-pay | Admitting: Physician Assistant

## 2023-12-18 ENCOUNTER — Telehealth: Admitting: Physician Assistant

## 2023-12-18 DIAGNOSIS — B349 Viral infection, unspecified: Secondary | ICD-10-CM

## 2023-12-18 MED ORDER — FLUTICASONE PROPIONATE 50 MCG/ACT NA SUSP: 2.0000 | Freq: Every day | NASAL | 0 refills | Status: AC

## 2023-12-18 MED ORDER — PROMETHAZINE-DM 6.25-15 MG/5ML PO SYRP: 5.0000 mL | ORAL_SOLUTION | Freq: Four times a day (QID) | ORAL | 0 refills | Status: AC | PRN

## 2023-12-18 NOTE — Patient Instructions (Signed)
  Darrell [name], thank you for joining Darrell Velma Lunger, PA-C for today's virtual visit.  While this provider is not your primary care provider (PCP), if your PCP is located in our provider database this encounter information will be shared with them immediately following your visit.   A Darrell [name] MyChart account gives you access to today's visit and all your visits, tests, and labs performed at [facility]  click here if you don't have a [facility] MyChart account or go to [email or web address]  Consent: (Patient) Darrell [name] provided verbal consent for this virtual visit at the beginning of the encounter.  Current Medications:  Current Outpatient Medications:    amoxicillin -clavulanate (AUGMENTIN ) 875-125 MG tablet, Take 1 tablet by mouth 2 (two) times daily., Disp: 20 tablet, Rfl: 0   dolutegravir -lamiVUDine (DOVATO ) 50-300 MG tablet, Take 1 tablet by mouth daily., Disp: 30 tablet, Rfl: 11   doxycycline  (VIBRA -TABS) 100 MG tablet, Take two tablets after sex to prevent STI, Disp: 60 tablet, Rfl: 5   Multiple Vitamin (MULTI-VITAMINS) TABS, Take by mouth., Disp: , Rfl:    Medications ordered in this encounter:  No orders of the defined types were placed in this encounter.    *If you need refills on other medications prior to your next appointment, please contact your pharmacy*  Follow-Up: Call back or seek an in-person evaluation if the symptoms worsen or if the condition fails to improve as anticipated.  [facility] [phone]  Other Instructions Please take a home COVID test and let me know ASAP of the results.  For now, continue to hydrate and rest. Ok to alternate Tylenol  and Ibuprofen  if needed for headache and body aches. Mucinex OTC to help thin congestion. Please take the prescribed medications as directed. We will adjust treatment based on COVID results.  If you note any non-resolving, new, or worsening symptoms despite  treatment, please seek an in-person evaluation ASAP.    If you have been instructed to have an in-person evaluation today at a local Urgent Care facility, please use the link below. It will take you to a list of all of our available [facility], including address, phone number and hours of operation. Please do not delay care.  [facility]  If you or a family member do not have a primary care provider, use the link below to schedule a visit and establish care. When you choose a [facility] primary care physician or advanced practice provider, you gain a long-term partner in health. Find a Primary Care Provider  Learn more about [facility]'s in-office and virtual care options: [facility] - Get Care Now

## 2023-12-18 NOTE — Progress Notes (Signed)
 Virtual Visit Consent   Darrell [name], you are scheduled for a virtual visit with a [facility] provider today. Just as with appointments in the office, your consent must be obtained to participate. Your consent will be active for this visit and any virtual visit you may have with one of our providers in the next 365 days. If you have a MyChart account, a copy of this consent can be sent to you electronically.  As this is a virtual visit, video technology does not allow for your provider to perform a traditional examination. This may limit your provider's ability to fully assess your condition. If your provider identifies any concerns that need to be evaluated in person or the need to arrange testing (such as labs, EKG, etc.), we will make arrangements to do so. Although advances in technology are sophisticated, we cannot ensure that it will always work on either your end or our end. If the connection with a video visit is poor, the visit may have to be switched to a telephone visit. With either a video or telephone visit, we are not always able to ensure that we have a secure connection.  By engaging in this virtual visit, you consent to the provision of healthcare and authorize for your insurance to be billed (if applicable) for the services provided during this visit. Depending on your insurance coverage, you may receive a charge related to this service.  I need to obtain your verbal consent now. Are you willing to proceed with your visit today? Darrell [name] has provided verbal consent on 12/18/2023 for a virtual visit (video or telephone). Darrell [name], [place]  Date: 12/18/2023 2:36 PM   Virtual Visit via Video Note   I, Darrell [name], connected with  Darrell [name]  ([id number], [birth year]) on 12/18/23 at  2:30 PM EDT by a video-enabled telemedicine application and verified that I am speaking with the correct person using two identifiers.  Location: Patient: Virtual Visit  Location Patient: Home Provider: Virtual Visit Location Provider: Home Office   I discussed the limitations of evaluation and management by telemedicine and the availability of in person appointments. The patient expressed understanding and agreed to proceed.    History of Present Illness: Darrell [name] is a 33 y.o. who identifies as a male who was assigned male at birth, and is being seen today for chest cold with symptoms starting overnight. Notes nasal and chest congestion with cough, post-nasal drip, loose stool and aches. Denies recent travel or sick contact. Denies chest pain or SOB.  Denies fever.  HPI: HPI  Problems:  Patient Active Problem List   Diagnosis Date Noted   Pap smear of anus with ASCUS 04/15/2023   Low grade intrepith lesion cyto smr anus (LGSIL) 04/15/2023   Vapes nicotine containing substance 04/17/2022   Anxiety and depression 04/17/2022   COVID-19 04/17/2022   Vaccine counseling 04/17/2022   Chronic right shoulder pain 03/22/2022   HTN (hypertension) 09/05/2018   AKI (acute kidney injury) 07/10/2016   Family history of colon cancer 06/21/2016   CRI (chronic renal insufficiency), stage 1 01/11/2016   Encounter for medical examination to establish care 09/09/2014   Elevated blood pressure 05/20/2014   HIV disease (HCC) 10/08/2013   HEADACHE, TENSION 11/09/2006   RHINITIS, ALLERGIC NEC 11/09/2006    Allergies:  Allergies  Allergen Reactions   Sulfonamide Derivatives     unknown   Medications:  Current Outpatient Medications:    fluticasone  (FLONASE ) 50 MCG/ACT  nasal spray, Place 2 sprays into both nostrils daily., Disp: 16 g, Rfl: 0   promethazine -dextromethorphan (PROMETHAZINE -DM) 6.25-15 MG/5ML syrup, Take 5 mLs by mouth 4 (four) times daily as needed for cough., Disp: 118 mL, Rfl: 0   amoxicillin -clavulanate (AUGMENTIN ) 875-125 MG tablet, Take 1 tablet by mouth 2 (two) times daily., Disp: 20 tablet, Rfl: 0   dolutegravir -lamiVUDine (DOVATO ) 50-300  MG tablet, Take 1 tablet by mouth daily., Disp: 30 tablet, Rfl: 11   doxycycline  (VIBRA -TABS) 100 MG tablet, Take two tablets after sex to prevent STI, Disp: 60 tablet, Rfl: 5   Multiple Vitamin (MULTI-VITAMINS) TABS, Take by mouth., Disp: , Rfl:   Observations/Objective: Patient is well-developed, well-nourished in no acute distress.  Resting comfortably at home.  Head is normocephalic, atraumatic.  No labored breathing.  Speech is clear and coherent with logical content.  Patient is alert and oriented at baseline.   Assessment and Plan: 1. Viral illness (Primary) - fluticasone  (FLONASE ) 50 MCG/ACT nasal spray; Place 2 sprays into both nostrils daily.  Dispense: 16 g; Refill: 0 - promethazine -dextromethorphan (PROMETHAZINE -DM) 6.25-15 MG/5ML syrup; Take 5 mLs by mouth 4 (four) times daily as needed for cough.  Dispense: 118 mL; Refill: 0  Want him to COVID test as a precaution giving health history. He is to let us  know ASAP if positive. For now, reviewed supportive measures and OTC medications for symptom management. Rx Flonase  and promethazine -DM. Work note provided -- will extend if COVID positive and discuss antivirals with patient.   Follow Up Instructions: I discussed the assessment and treatment plan with the patient. The patient was provided an opportunity to ask questions and all were answered. The patient agreed with the plan and demonstrated an understanding of the instructions.  A copy of instructions were sent to the patient via MyChart unless otherwise noted below.   The patient was advised to call back or seek an in-person evaluation if the symptoms worsen or if the condition fails to improve as anticipated.    Darrell Velma Lunger, PA-C

## 2023-12-20 ENCOUNTER — Encounter: Payer: Self-pay | Admitting: Physician Assistant

## 2024-03-12 ENCOUNTER — Ambulatory Visit: Admitting: Infectious Disease

## 2024-04-28 ENCOUNTER — Ambulatory Visit: Payer: Self-pay | Admitting: Infectious Disease
